# Patient Record
Sex: Female | Born: 1950 | Race: White | Hispanic: No | Marital: Married | Smoking: Former smoker
Health system: Southern US, Community
[De-identification: ages and names within clinical notes are randomized; demographics above are authoritative.]

## PROBLEM LIST (undated history)

## (undated) DIAGNOSIS — I10 Essential (primary) hypertension: Secondary | ICD-10-CM

## (undated) DIAGNOSIS — E119 Type 2 diabetes mellitus without complications: Secondary | ICD-10-CM

## (undated) DIAGNOSIS — I2510 Atherosclerotic heart disease of native coronary artery without angina pectoris: Secondary | ICD-10-CM

## (undated) DIAGNOSIS — D649 Anemia, unspecified: Secondary | ICD-10-CM

## (undated) DIAGNOSIS — J449 Chronic obstructive pulmonary disease, unspecified: Secondary | ICD-10-CM

## (undated) DIAGNOSIS — K219 Gastro-esophageal reflux disease without esophagitis: Secondary | ICD-10-CM

## (undated) HISTORY — PX: CARPAL TUNNEL RELEASE: SHX101

## (undated) HISTORY — PX: SPINE SURGERY: SHX786

## (undated) HISTORY — DX: Gastro-esophageal reflux disease without esophagitis: K21.9

## (undated) HISTORY — PX: ABDOMINAL HYSTERECTOMY: SHX81

## (undated) HISTORY — DX: Chronic obstructive pulmonary disease, unspecified: J44.9

---

## 1998-05-14 HISTORY — PX: NECK SURGERY: SHX720

## 2003-04-17 ENCOUNTER — Other Ambulatory Visit: Payer: Self-pay

## 2003-04-27 ENCOUNTER — Other Ambulatory Visit: Payer: Self-pay

## 2004-06-15 ENCOUNTER — Other Ambulatory Visit: Payer: Self-pay

## 2004-06-15 ENCOUNTER — Emergency Department: Payer: Self-pay | Admitting: Emergency Medicine

## 2004-10-21 ENCOUNTER — Emergency Department: Payer: Self-pay | Admitting: Emergency Medicine

## 2005-05-17 ENCOUNTER — Emergency Department: Payer: Self-pay | Admitting: Emergency Medicine

## 2005-05-17 ENCOUNTER — Other Ambulatory Visit: Payer: Self-pay

## 2006-01-31 IMAGING — CT CT CHEST W/ CM
1 of 2 series · 16 of 32 positions shown, 20 images · non-contrast
Comparison: none

REASON FOR EXAM: chest pain
COMMENTS:

PROCEDURE:     CT  - CT CHEST (FOR PE) W  - May 17, 2005 [DATE]
RESULT:     Enhanced emergent Chest CT was obtained for shortness of breath
and elevated D.dimer.

[Series 4: soft tissue · axial · 0.71mm/px · z∈[+234,+486]mm · 16 of 92 slices shown, 20 images]
[im 4/92  mediastinal]
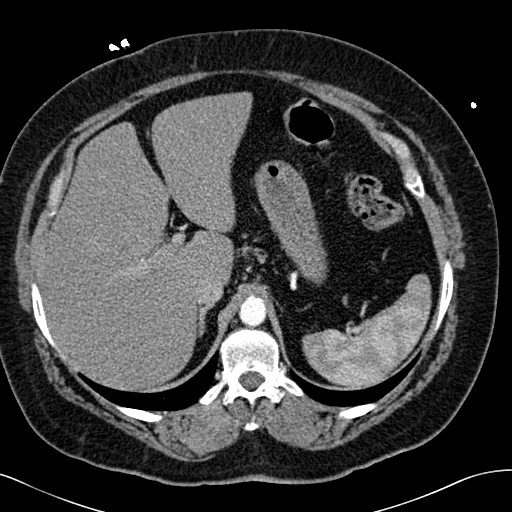
[im 4/92  lung]
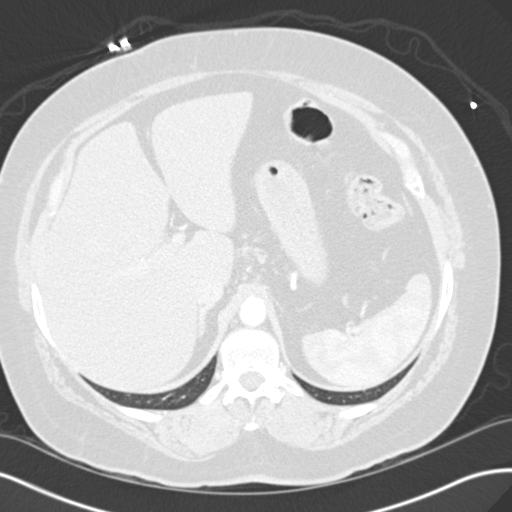
[im 12/92  lung]
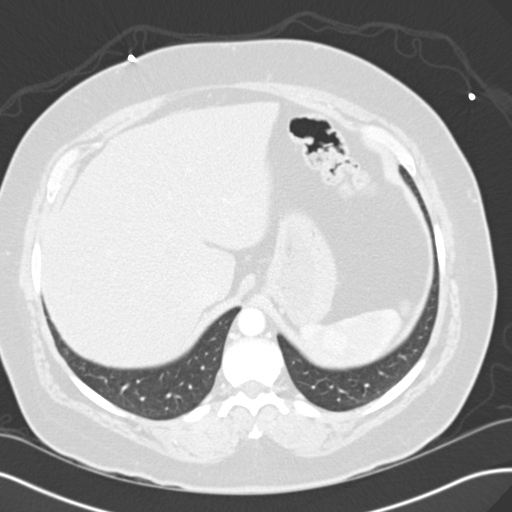
[im 20/92  lung]
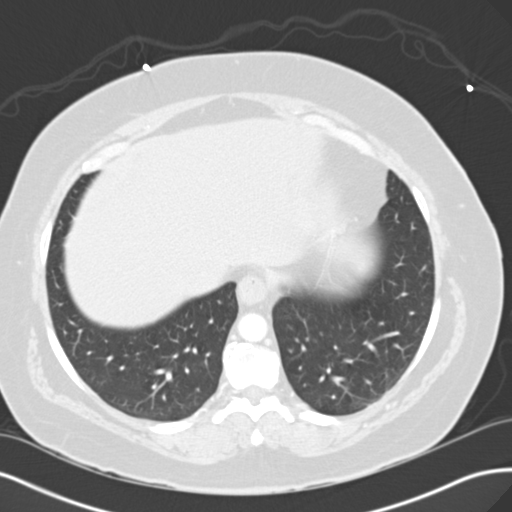
[im 23/92  lung]
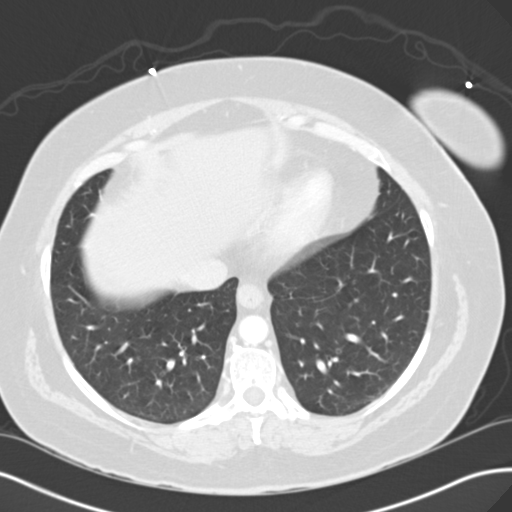
[im 28/92  mediastinal]
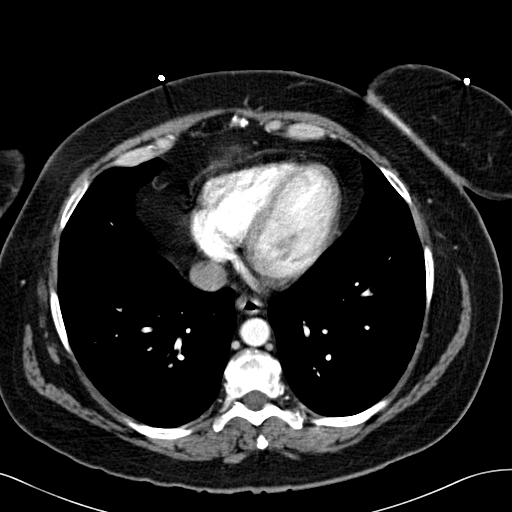
[im 28/92  lung]
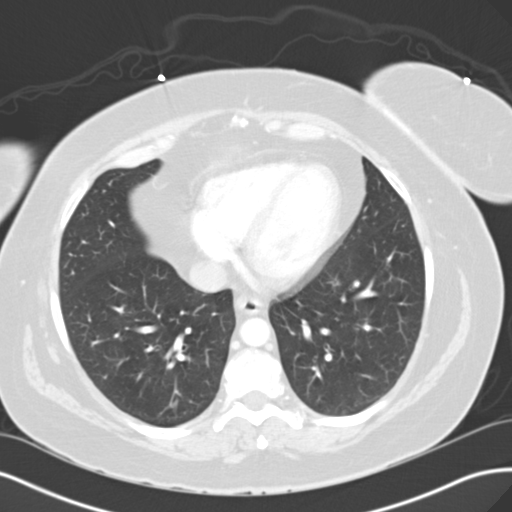
[im 32/92  lung]
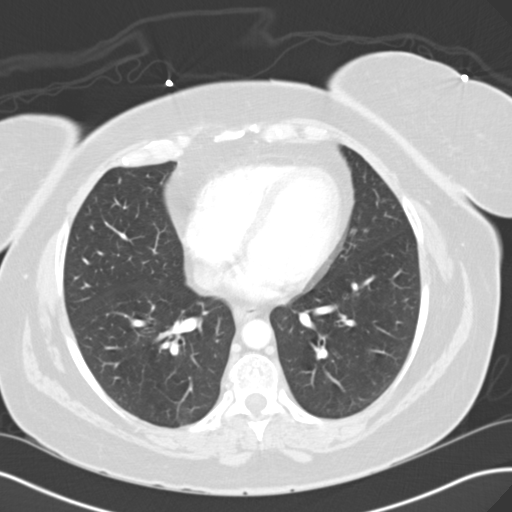
[im 37/92  lung]
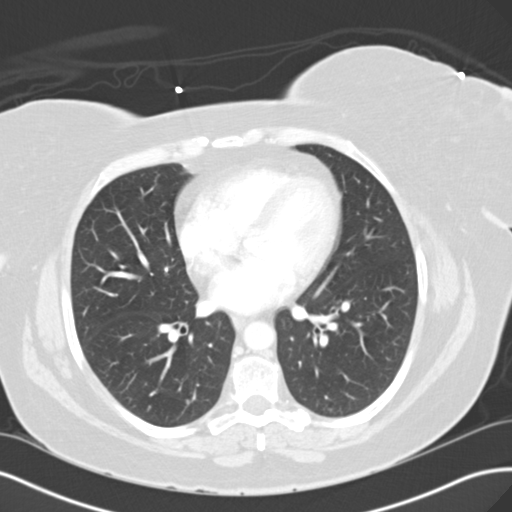
[im 44/92  lung]
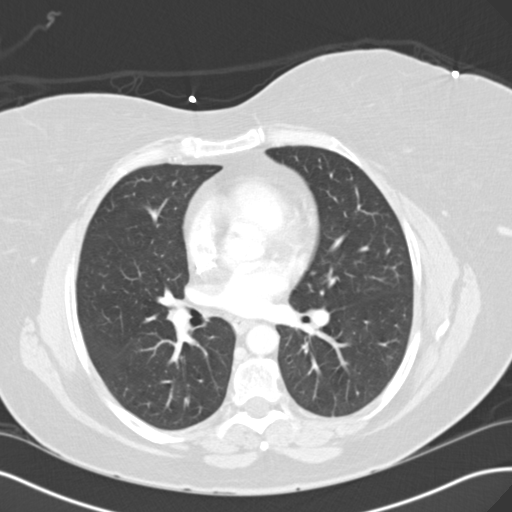
[im 46/92  mediastinal]
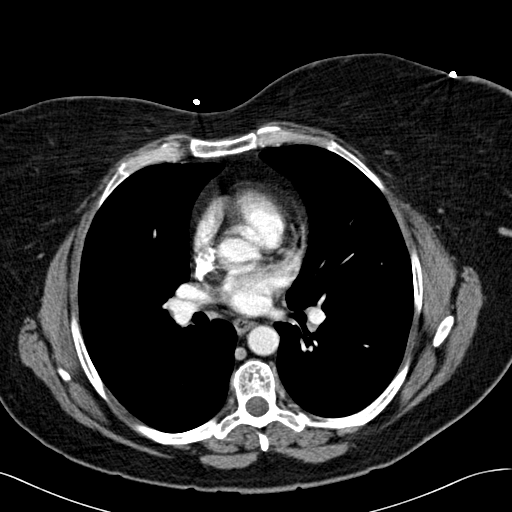
[im 46/92  lung]
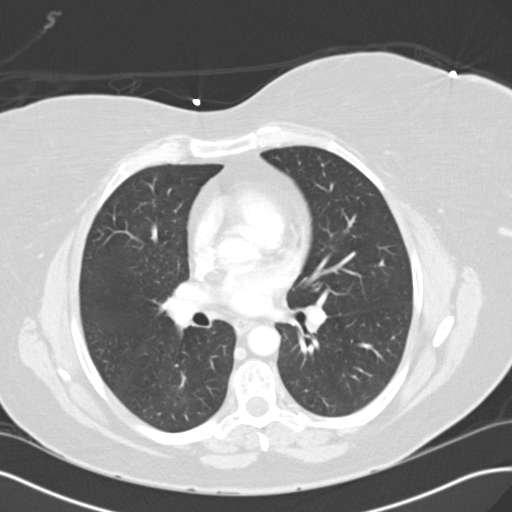
[im 52/92  lung]
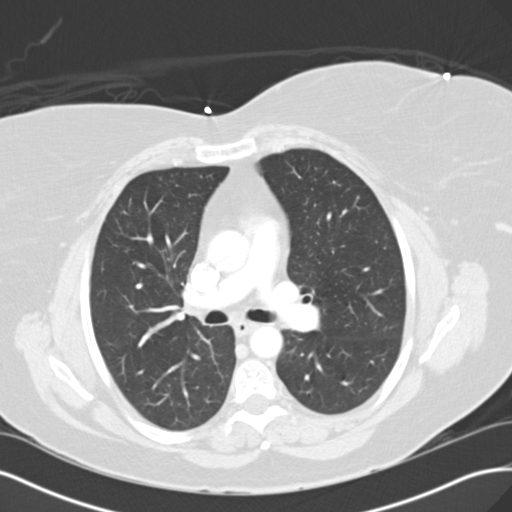
[im 60/92  lung]
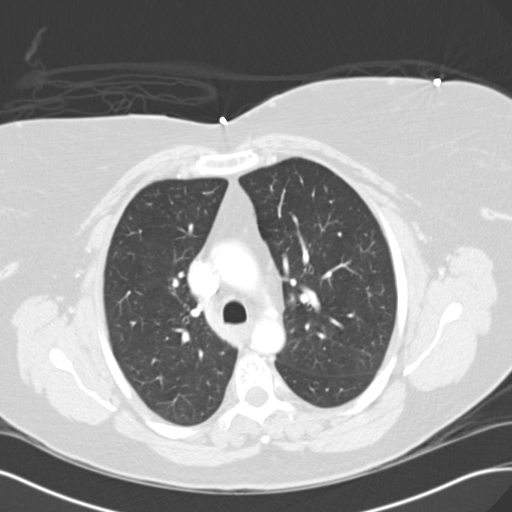
[im 64/92  lung]
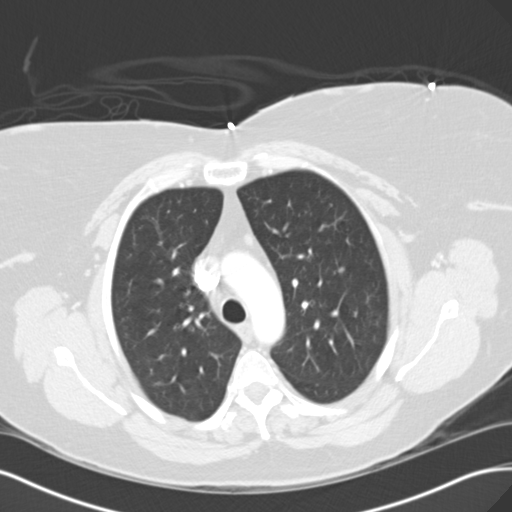
[im 69/92  mediastinal]
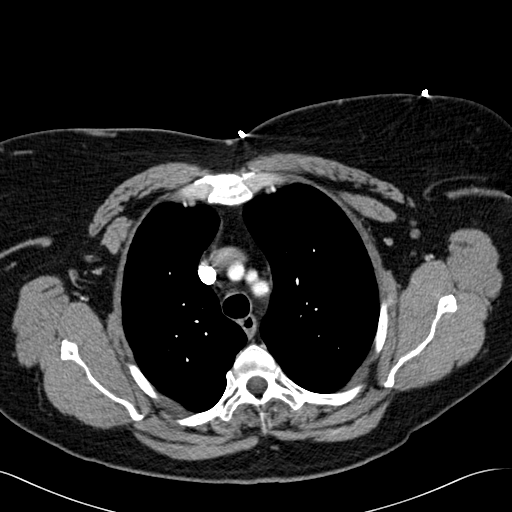
[im 69/92  lung]
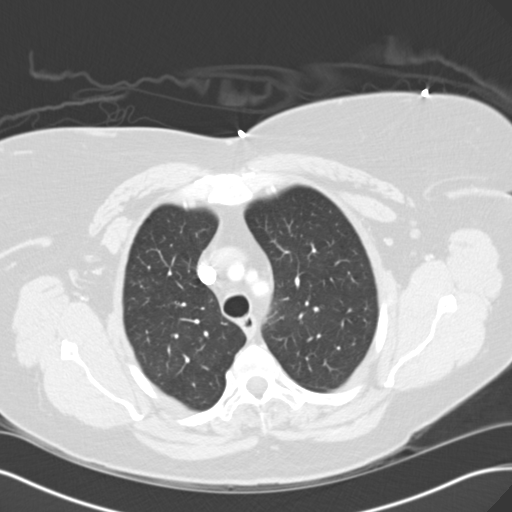
[im 72/92  lung]
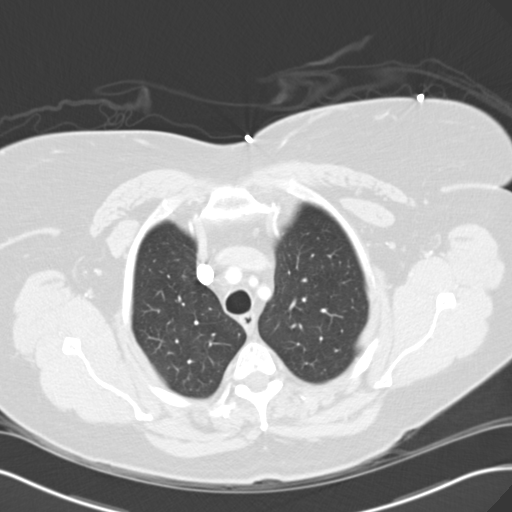
[im 80/92  lung]
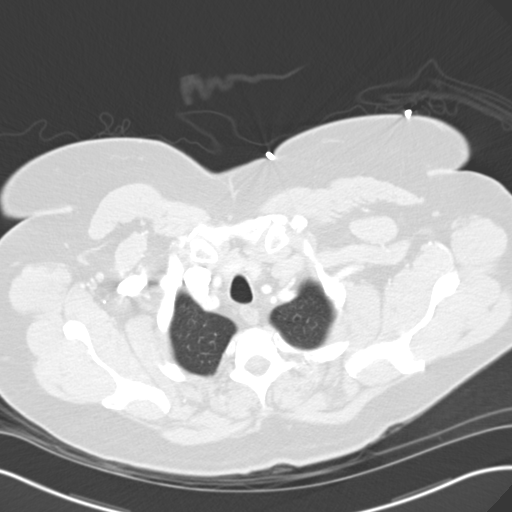
[im 88/92  lung]
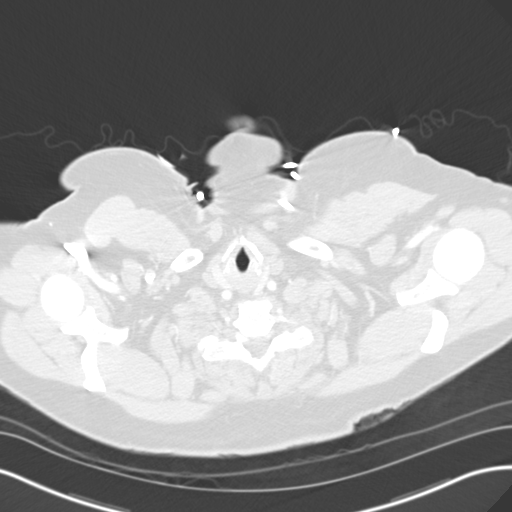

[16 of 32 positions shown; findings below may reference images not displayed]

FINDINGS: There is a good bolus of contrast within the pulmonary arteries.
No filling defects are noted in the visualized portion of the pulmonary
arteries to suggest pulmonary embolus.

No mediastinal masses are seen. No hilar masses are evident. No subcarinal
adenopathy is noted.

On the lung window settings the lung fields appear clear. No effusions are
identified.
IMPRESSION: 1)No definite evidence of pulmonary embolus.

The report was called to the [HOSPITAL] the conclusion of
dictation.

## 2006-03-10 ENCOUNTER — Emergency Department: Payer: Self-pay | Admitting: Emergency Medicine

## 2006-07-08 ENCOUNTER — Ambulatory Visit: Payer: Self-pay

## 2007-07-01 ENCOUNTER — Emergency Department: Payer: Self-pay | Admitting: Emergency Medicine

## 2007-09-25 ENCOUNTER — Emergency Department: Payer: Self-pay | Admitting: Emergency Medicine

## 2007-12-16 ENCOUNTER — Ambulatory Visit: Payer: Self-pay

## 2007-12-30 ENCOUNTER — Ambulatory Visit: Payer: Self-pay | Admitting: Nurse Practitioner

## 2008-03-16 ENCOUNTER — Emergency Department: Payer: Self-pay | Admitting: Emergency Medicine

## 2008-08-23 ENCOUNTER — Emergency Department: Payer: Self-pay | Admitting: Emergency Medicine

## 2009-03-08 ENCOUNTER — Observation Stay: Payer: Self-pay | Admitting: Internal Medicine

## 2009-07-09 ENCOUNTER — Emergency Department: Payer: Self-pay | Admitting: Internal Medicine

## 2009-09-21 ENCOUNTER — Ambulatory Visit: Payer: Self-pay | Admitting: Adult Health

## 2009-10-26 ENCOUNTER — Observation Stay: Payer: Self-pay | Admitting: *Deleted

## 2011-01-24 ENCOUNTER — Emergency Department: Payer: Self-pay | Admitting: Emergency Medicine

## 2011-03-16 ENCOUNTER — Emergency Department: Payer: Self-pay | Admitting: Internal Medicine

## 2012-05-03 ENCOUNTER — Emergency Department: Payer: Self-pay | Admitting: Emergency Medicine

## 2012-05-03 LAB — COMPREHENSIVE METABOLIC PANEL
Albumin: 3.7 g/dL (ref 3.4–5.0)
Anion Gap: 8 (ref 7–16)
Bilirubin,Total: 0.4 mg/dL (ref 0.2–1.0)
Chloride: 101 mmol/L (ref 98–107)
Co2: 26 mmol/L (ref 21–32)
Creatinine: 1.2 mg/dL (ref 0.60–1.30)
EGFR (African American): 56 — ABNORMAL LOW
Glucose: 141 mg/dL — ABNORMAL HIGH (ref 65–99)
Osmolality: 273 (ref 275–301)
Potassium: 3.4 mmol/L — ABNORMAL LOW (ref 3.5–5.1)
SGPT (ALT): 25 U/L (ref 12–78)
Sodium: 135 mmol/L — ABNORMAL LOW (ref 136–145)
Total Protein: 8.1 g/dL (ref 6.4–8.2)

## 2012-05-03 LAB — CBC
HGB: 13.7 g/dL (ref 12.0–16.0)
MCH: 30.4 pg (ref 26.0–34.0)
MCHC: 35.5 g/dL (ref 32.0–36.0)
MCV: 86 fL (ref 80–100)
Platelet: 265 10*3/uL (ref 150–440)
RBC: 4.49 10*6/uL (ref 3.80–5.20)

## 2012-05-03 LAB — URINALYSIS, COMPLETE
Bacteria: NONE SEEN
Bilirubin,UR: NEGATIVE
Blood: NEGATIVE
Ketone: NEGATIVE
Nitrite: NEGATIVE
Ph: 5 (ref 4.5–8.0)
Specific Gravity: 1.006 (ref 1.003–1.030)
Squamous Epithelial: 3
WBC UR: <1 /HPF (ref 0–5)

## 2012-05-03 LAB — RAPID INFLUENZA A&B ANTIGENS

## 2012-09-23 ENCOUNTER — Emergency Department: Payer: Self-pay | Admitting: Emergency Medicine

## 2012-11-23 ENCOUNTER — Emergency Department: Payer: Self-pay | Admitting: Emergency Medicine

## 2013-07-15 ENCOUNTER — Emergency Department: Payer: Self-pay | Admitting: Emergency Medicine

## 2013-07-15 LAB — CBC WITH DIFFERENTIAL/PLATELET
BASOS ABS: 0.1 10*3/uL (ref 0.0–0.1)
Basophil %: 0.7 %
Eosinophil #: 0.3 10*3/uL (ref 0.0–0.7)
Eosinophil %: 3.4 %
HCT: 36.2 % (ref 35.0–47.0)
HGB: 12.2 g/dL (ref 12.0–16.0)
LYMPHS ABS: 2 10*3/uL (ref 1.0–3.6)
LYMPHS PCT: 20 %
MCH: 29.2 pg (ref 26.0–34.0)
MCHC: 33.8 g/dL (ref 32.0–36.0)
MCV: 87 fL (ref 80–100)
MONO ABS: 0.8 x10 3/mm (ref 0.2–0.9)
Monocyte %: 7.8 %
Neutrophil #: 6.8 10*3/uL — ABNORMAL HIGH (ref 1.4–6.5)
Neutrophil %: 68.1 %
PLATELETS: 309 10*3/uL (ref 150–440)
RBC: 4.19 10*6/uL (ref 3.80–5.20)
RDW: 13.7 % (ref 11.5–14.5)
WBC: 10 10*3/uL (ref 3.6–11.0)

## 2013-07-15 LAB — BASIC METABOLIC PANEL
Anion Gap: 4 — ABNORMAL LOW (ref 7–16)
BUN: 14 mg/dL (ref 7–18)
CALCIUM: 8.6 mg/dL (ref 8.5–10.1)
CHLORIDE: 99 mmol/L (ref 98–107)
CREATININE: 1.09 mg/dL (ref 0.60–1.30)
Co2: 31 mmol/L (ref 21–32)
EGFR (African American): >60
EGFR (Non-African Amer.): 54 — ABNORMAL LOW
Glucose: 202 mg/dL — ABNORMAL HIGH (ref 65–99)
Osmolality: 274 (ref 275–301)
POTASSIUM: 4 mmol/L (ref 3.5–5.1)
SODIUM: 134 mmol/L — AB (ref 136–145)

## 2013-07-15 LAB — URIC ACID: URIC ACID: 5.1 mg/dL (ref 2.6–6.0)

## 2014-01-05 ENCOUNTER — Emergency Department: Payer: Self-pay | Admitting: Emergency Medicine

## 2014-03-09 LAB — HEPATIC FUNCTION PANEL
ALT: 12 U/L (ref 7–35)
AST: 12 U/L — AB (ref 13–35)
Alkaline Phosphatase: 71 U/L (ref 25–125)
BILIRUBIN DIRECT: 0.12 mg/dL (ref 0.01–0.4)
Bilirubin, Total: 0.4 mg/dL

## 2014-03-09 LAB — TSH: TSH: 2.01 u[IU]/mL (ref 0.41–5.90)

## 2014-04-13 ENCOUNTER — Emergency Department: Payer: Self-pay | Admitting: Emergency Medicine

## 2014-04-25 ENCOUNTER — Emergency Department: Payer: Self-pay | Admitting: Emergency Medicine

## 2014-05-25 ENCOUNTER — Emergency Department: Payer: Self-pay | Admitting: Emergency Medicine

## 2014-07-06 LAB — BASIC METABOLIC PANEL
BUN: 10 mg/dL (ref 4–21)
Creatinine: 0.9 mg/dL (ref 0.5–1.1)
GLUCOSE: 193 mg/dL
Potassium: 4.3 mmol/L (ref 3.4–5.3)
SODIUM: 138 mmol/L (ref 137–147)

## 2014-07-06 LAB — LIPID PANEL
Cholesterol: 133 mg/dL (ref 0–200)
HDL: 43 mg/dL (ref 35–70)
LDL CALC: 50 mg/dL
Triglycerides: 199 mg/dL — AB (ref 40–160)

## 2014-07-06 LAB — CBC AND DIFFERENTIAL
HCT: 37 % (ref 36–46)
Hemoglobin: 12.1 g/dL (ref 12.0–16.0)
NEUTROS ABS: 7 /uL
Platelets: 368 10*3/uL (ref 150–399)
WBC: 10.8 10^3/mL

## 2014-07-06 LAB — HEMOGLOBIN A1C: Hemoglobin A1C: 7.2

## 2014-08-24 ENCOUNTER — Ambulatory Visit
Admit: 2014-08-24 | Disposition: A | Discharge status: Home / Self Care | Payer: Self-pay | Attending: Internal Medicine | Admitting: Internal Medicine

## 2014-09-27 ENCOUNTER — Encounter (INDEPENDENT_AMBULATORY_CARE_PROVIDER_SITE_OTHER): Payer: Self-pay

## 2014-09-27 ENCOUNTER — Encounter: Payer: Self-pay | Admitting: Pharmacist

## 2014-11-09 DIAGNOSIS — C801 Malignant (primary) neoplasm, unspecified: Secondary | ICD-10-CM | POA: Insufficient documentation

## 2014-11-09 DIAGNOSIS — I10 Essential (primary) hypertension: Secondary | ICD-10-CM | POA: Insufficient documentation

## 2014-11-09 DIAGNOSIS — E785 Hyperlipidemia, unspecified: Secondary | ICD-10-CM | POA: Insufficient documentation

## 2014-11-09 DIAGNOSIS — E119 Type 2 diabetes mellitus without complications: Secondary | ICD-10-CM | POA: Insufficient documentation

## 2015-01-09 IMAGING — CR DG FOOT COMPLETE 3+V*L*
1 series · 3 of 3 positions shown · non-contrast
Comparison: None.

CLINICAL DATA: Pain across the dorsal aspect of the foot. Dropped
can on foot last night. No swelling. Redness across the mid
tarsal/proximal metatarsal area. Unable to bear weight.

EXAM:
LEFT FOOT - COMPLETE 3+ VIEW

[Series 1: dxr foot lt comp w/obliques · 0.14mm/px · 3 of 3 slices shown]
[im 1/3]
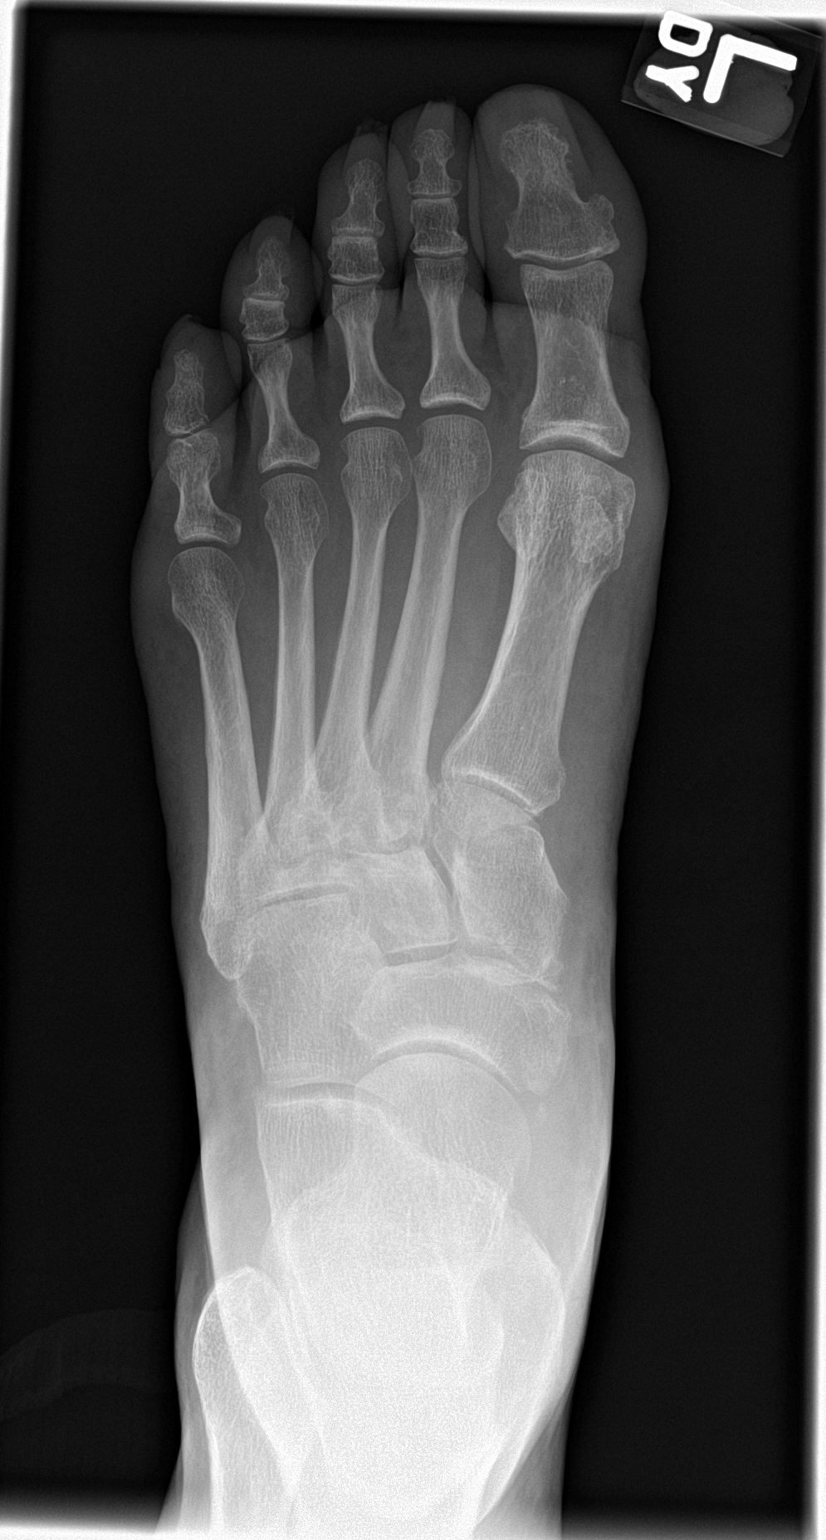
[im 2/3]
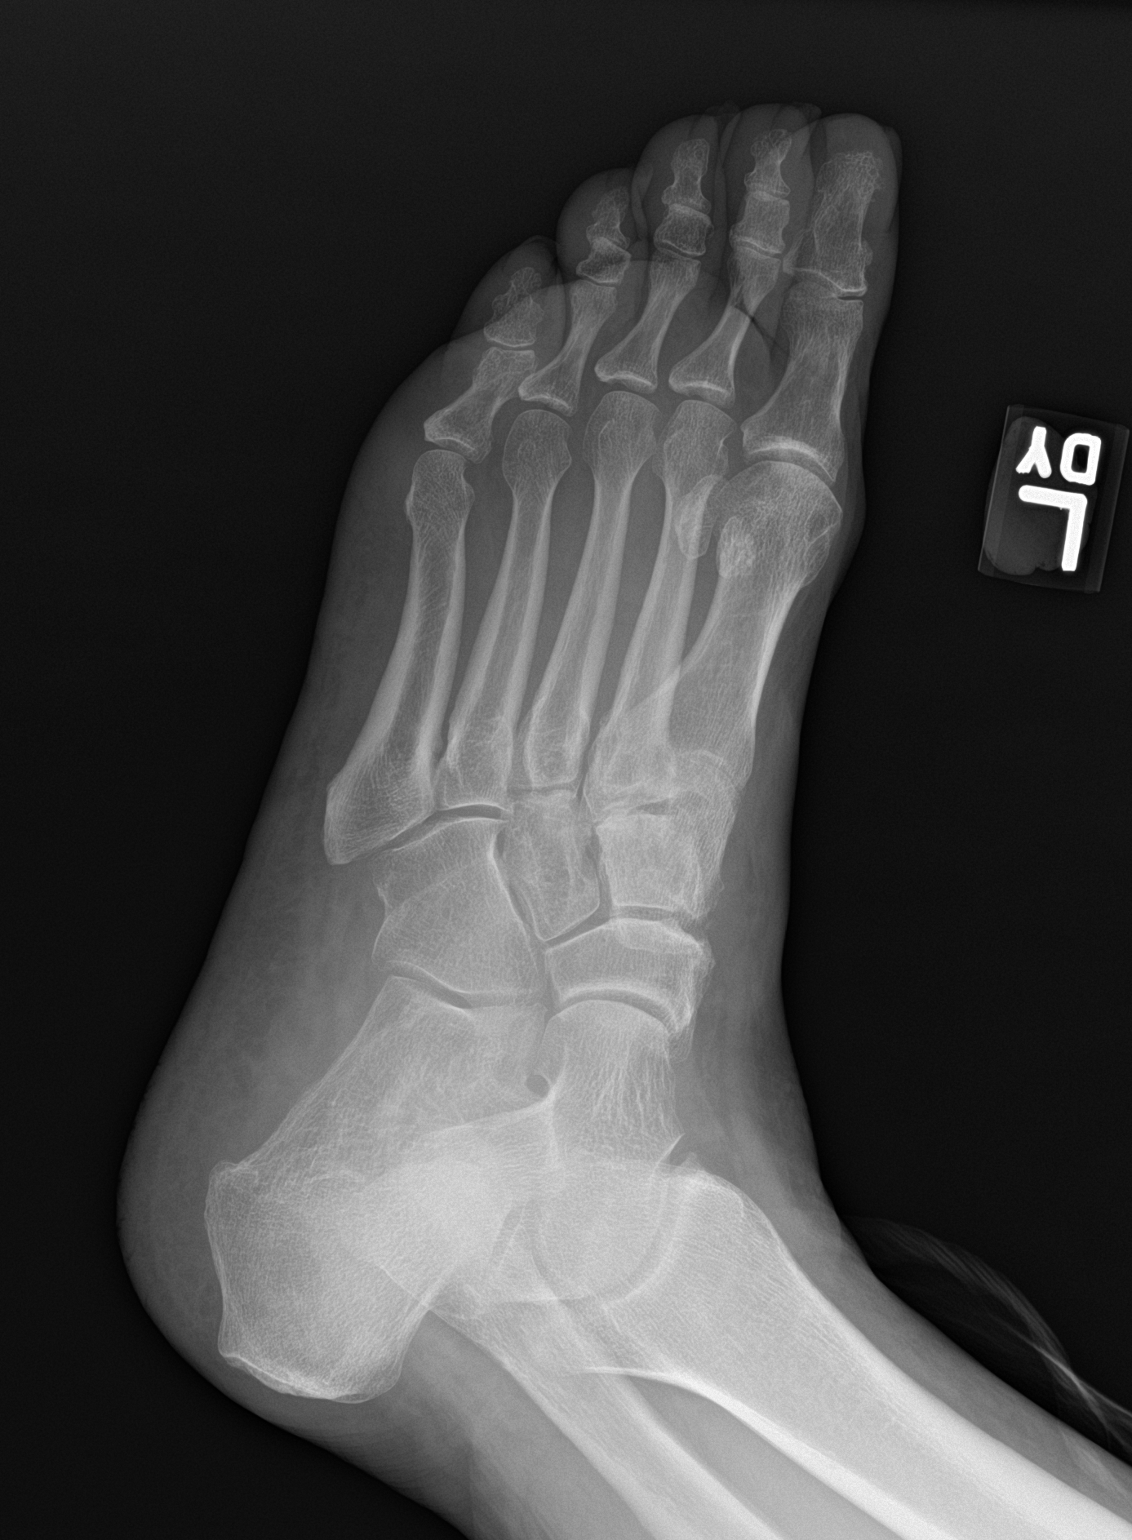
[im 3/3]
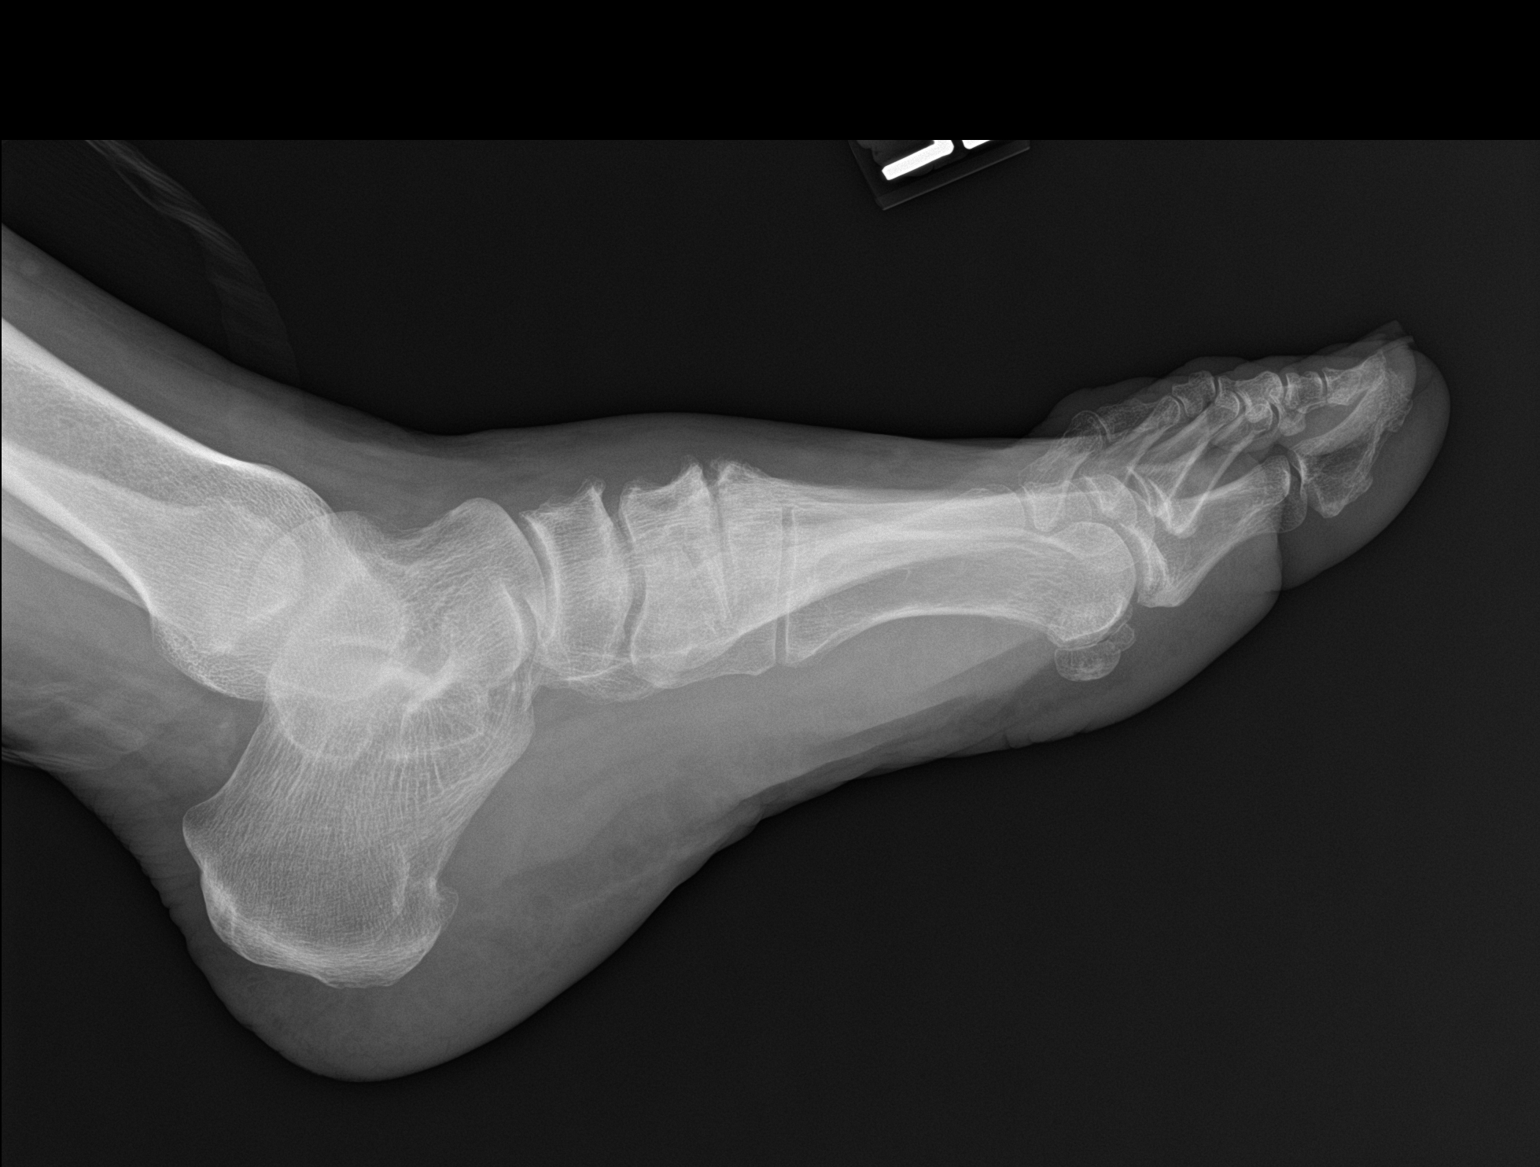

[3 of 3 positions shown; findings below may reference images not displayed]

FINDINGS: There is soft tissue swelling along the dorsum of the foot. No acute
fracture or subluxation. No radiopaque foreign body or soft tissue
gas.
IMPRESSION: Soft tissue swelling.

## 2015-01-21 ENCOUNTER — Other Ambulatory Visit (HOSPITAL_COMMUNITY): Payer: Self-pay | Admitting: *Deleted

## 2015-01-24 ENCOUNTER — Other Ambulatory Visit: Payer: Self-pay | Admitting: *Deleted

## 2015-01-24 DIAGNOSIS — M5442 Lumbago with sciatica, left side: Secondary | ICD-10-CM

## 2015-02-01 ENCOUNTER — Ambulatory Visit: Admission: RE | Admit: 2015-02-01 | Payer: Medicare HMO | Source: Ambulatory Visit

## 2015-02-10 ENCOUNTER — Ambulatory Visit: Admission: RE | Admit: 2015-02-10 | Payer: Commercial Managed Care - HMO | Source: Ambulatory Visit

## 2015-02-22 ENCOUNTER — Ambulatory Visit
Admission: RE | Admit: 2015-02-22 | Discharge: 2015-02-22 | Disposition: A | Discharge status: Home / Self Care | Payer: Medicare HMO | Source: Ambulatory Visit | Attending: *Deleted | Admitting: *Deleted

## 2015-03-03 ENCOUNTER — Ambulatory Visit
Admission: RE | Admit: 2015-03-03 | Discharge: 2015-03-03 | Disposition: A | Discharge status: Home / Self Care | Payer: Medicare HMO | Source: Ambulatory Visit | Attending: *Deleted | Admitting: *Deleted

## 2015-03-03 DIAGNOSIS — M5442 Lumbago with sciatica, left side: Secondary | ICD-10-CM | POA: Diagnosis present

## 2015-03-03 DIAGNOSIS — M4806 Spinal stenosis, lumbar region: Secondary | ICD-10-CM | POA: Insufficient documentation

## 2015-03-03 DIAGNOSIS — Q7649 Other congenital malformations of spine, not associated with scoliosis: Secondary | ICD-10-CM | POA: Diagnosis not present

## 2015-03-22 ENCOUNTER — Encounter: Payer: Self-pay | Admitting: Pharmacist

## 2015-06-20 DIAGNOSIS — E119 Type 2 diabetes mellitus without complications: Secondary | ICD-10-CM | POA: Insufficient documentation

## 2015-09-10 ENCOUNTER — Encounter: Payer: Self-pay | Admitting: Emergency Medicine

## 2015-09-10 ENCOUNTER — Emergency Department
Admission: EM | Admit: 2015-09-10 | Discharge: 2015-09-10 | Disposition: A | Discharge status: Home / Self Care | Payer: Medicare Other | Attending: Emergency Medicine | Admitting: Emergency Medicine

## 2015-09-10 DIAGNOSIS — Z87891 Personal history of nicotine dependence: Secondary | ICD-10-CM | POA: Diagnosis not present

## 2015-09-10 DIAGNOSIS — E119 Type 2 diabetes mellitus without complications: Secondary | ICD-10-CM | POA: Diagnosis not present

## 2015-09-10 DIAGNOSIS — W57XXXA Bitten or stung by nonvenomous insect and other nonvenomous arthropods, initial encounter: Secondary | ICD-10-CM | POA: Insufficient documentation

## 2015-09-10 DIAGNOSIS — S60561A Insect bite (nonvenomous) of right hand, initial encounter: Secondary | ICD-10-CM | POA: Insufficient documentation

## 2015-09-10 DIAGNOSIS — Y929 Unspecified place or not applicable: Secondary | ICD-10-CM | POA: Insufficient documentation

## 2015-09-10 DIAGNOSIS — I10 Essential (primary) hypertension: Secondary | ICD-10-CM | POA: Diagnosis not present

## 2015-09-10 DIAGNOSIS — Y999 Unspecified external cause status: Secondary | ICD-10-CM | POA: Insufficient documentation

## 2015-09-10 DIAGNOSIS — Y939 Activity, unspecified: Secondary | ICD-10-CM | POA: Insufficient documentation

## 2015-09-10 HISTORY — DX: Type 2 diabetes mellitus without complications: E11.9

## 2015-09-10 HISTORY — DX: Essential (primary) hypertension: I10

## 2015-09-10 MED ORDER — NAPROXEN 500 MG PO TABS
500.0000 mg | ORAL_TABLET | Freq: Once | ORAL | Status: AC
  Administered 2015-09-10: 500 mg via ORAL
  Filled 2015-09-10: qty 1

## 2015-09-10 MED ORDER — DIPHENHYDRAMINE HCL 25 MG PO CAPS
50.0000 mg | ORAL_CAPSULE | Freq: Once | ORAL | Status: AC
  Administered 2015-09-10: 50 mg via ORAL
  Filled 2015-09-10: qty 2

## 2015-09-10 MED ORDER — NAPROXEN 500 MG PO TBEC: 500.0000 mg | DELAYED_RELEASE_TABLET | Freq: Two times a day (BID) | ORAL | Status: DC

## 2015-09-10 NOTE — ED Notes (Signed)
Discussed discharge instructions, prescriptions, and follow-up care with patient. No questions or concerns at this time. Pt stable at discharge.  

## 2015-09-10 NOTE — Discharge Instructions (Signed)
Insect Bite Mosquitoes, flies, fleas, bedbugs, and many other insects can bite. Insect bites are different from insect stings. A sting is when poison (venom) is injected into the skin. Insect bites can cause pain or itching for a few days, but they are usually not serious. Some insects can spread diseases to people through a bite. SYMPTOMS  Symptoms of an insect bite include:  Itching or pain in the bite area.  Redness and swelling in the bite area.  An open wound (skin ulcer). In many cases, symptoms last for 2-4 days.  DIAGNOSIS  This condition is usually diagnosed based on symptoms and a physical exam. TREATMENT  Treatment is usually not needed for an insect bite. Symptoms often go away on their own. Your health care provider may recommend creams or lotions to help reduce itching. Antibiotic medicines may be prescribed if the bite becomes infected. A tetanus shot may be given in some cases. If you develop an allergic reaction to an insect bite, your health care provider will prescribe medicines to treat the reaction (antihistamines). This is rare. HOME CARE INSTRUCTIONS  Do not scratch the bite area.  Keep the bite area clean and dry. Wash the bite area daily with soap and water as told by your health care provider.  If directed, applyice to the bite area.  Put ice in a plastic bag.  Place a towel between your skin and the bag.  Leave the ice on for 20 minutes, 2-3 times per day.  To help reduce itching and swelling, try applying a baking soda paste, cortisone cream, or calamine lotion to the bite area as told by your health care provider.  Apply or take over-the-counter and prescription medicines only as told by your health care provider.  If you were prescribed an antibiotic medicine, use it as told by your health care provider. Do not stop using the antibiotic even if your condition improves.  Keep all follow-up visits as told by your health care provider. This is  important. PREVENTION   Use insect repellent. The best insect repellents contain:  DEET, picaridin, oil of lemon eucalyptus (OLE), or IR3535.  Higher amounts of an active ingredient.  When you are outdoors, wear clothing that covers your arms and legs.  Avoid opening windows that do not have window screens. SEEK MEDICAL CARE IF:  You have increased redness, swelling, or pain in the bite area.  You have a fever. SEEK IMMEDIATE MEDICAL CARE IF:   You have joint pain.   You have fluid, blood, or pus coming from the bite area.  You have a headache or neck pain.  You have unusual weakness.  You have a rash.  You have chest pain or shortness of breath.  You have abdominal pain, nausea, or vomiting.  You feel unusually tired or sleepy.   This information is not intended to replace advice given to you by your health care provider. Make sure you discuss any questions you have with your health care provider.   Document Released: 06/07/2004 Document Revised: 01/19/2015 Document Reviewed: 09/15/2014 Elsevier Interactive Patient Education 2016 [place] appear to be having an allergic reaction to an insect sting to your hand. Take Benadryl every 4-6 hours as needed. Apply ice to reduce swelling.

## 2015-09-10 NOTE — ED Notes (Signed)
Reports "something bit me".  Swelling and itching to right hand.

## 2015-09-10 NOTE — ED Provider Notes (Signed)
[facility] Emergency Department Provider Note ____________________________________________  Time seen: 1056  I have reviewed the triage vital signs and the nursing notes.  HISTORY  Chief Complaint  Hand Pain  HPI Heidi Edwards is a 65 y.o. female presents to the ED for the evaluation of an insect bite or sting to her right hand. The patient's [place] Yesterday Evening, When She Felt an Immediate Bite or Sting to Her Dorsal Right Hand. She immediately Slapped the Hand, but Did Not See the Insect.She noted itching and minimal swelling locally. She denies any first aid measures in the interim. She denies any history of anaphylaxis, hypersensitivity, or atopy. She is without complaints of respiratory distress or mucous membrane swelling.   Past Medical History  Diagnosis Date  . Hypertension   . Diabetes mellitus without complication ([place])     There are no active problems to display for this patient.   Past Surgical History  Procedure Laterality Date  . Abdominal hysterectomy      Current Outpatient Rx  Name  Route  Sig  Dispense  Refill  . naproxen (EC NAPROSYN) 500 MG EC tablet   Oral   Take 1 tablet (500 mg total) by mouth 2 (two) times daily with a meal.   30 tablet   0    Allergies Review of patient's allergies indicates no known allergies.  No family history on file.  Social History Social History  Substance Use Topics  . Smoking status: Former Research scientist (life sciences)  . Smokeless tobacco: None  . Alcohol Use: None   Review of Systems  Constitutional: Negative for fever. Eyes: Negative for visual changes. ENT: Negative for sore throat. Cardiovascular: Negative for chest pain. Respiratory: Negative for shortness of breath. Skin: Negative for rash. Right hand swelling and itching as above.  Neurological: Negative for headaches, focal weakness or numbness. ____________________________________________  PHYSICAL EXAM:  VITAL SIGNS: ED Triage  Vitals  Enc Vitals Group     BP 09/10/15 0917 153/61 mmHg     Pulse Rate 09/10/15 0917 98     Resp 09/10/15 0917 18     Temp 09/10/15 0917 98.1 F (36.7 C)     Temp Source 09/10/15 0917 Oral     SpO2 09/10/15 0917 98 %     Weight 09/10/15 0917 195 lb (88.451 kg)     Height 09/10/15 0917 5\' 5"  (1.651 m)     Head Cir --      Peak Flow --      Pain Score --      Pain Loc --      Pain Edu? --      Excl. in [place]? --    Constitutional: Alert and oriented. Well appearing and in no distress. Head: Normocephalic and atraumatic. Hematological/Lymphatic/Immunological: No epitrochlear lymphadenopathy. Cardiovascular: Normal distal pulses and capillary refill. Respiratory: Normal respiratory effort.  Musculoskeletal: Nontender with normal range of motion in all extremities.  Neurologic:  Normal gait without ataxia. Normal speech and language. No gross focal neurologic deficits are appreciated. Skin:  Skin is warm, dry and intact. No rash noted. Right hand with moderate dorsal swelling and some moderate faint, erythema from the knuckles to the wrist. No induration, pointing fluctuance, or drainage.  ____________________________________________  PROCEDURES  Benadryl 50 mg PO Naprosyn 500 mg PO ____________________________________________  INITIAL IMPRESSION / ASSESSMENT AND PLAN / ED COURSE  Patient with a local reaction to an insect bite or sting to the dorsal right hand. There is no sign of  general anaphylaxis or infection or cellulitis. Patient is provided with antihistamine and anti-inflammatory medications in the ED. She is advised to continue to apply ice to the hand for comfort. She will dose over-the-counter Benadryl every 4-6 hours as needed. She is also provided with a prescription for Naprosyn to dose as needed for pain and inflammation. She should follow with her primary care provider for ongoing symptom management. ____________________________________________  FINAL CLINICAL  IMPRESSION(S) / ED DIAGNOSES  Final diagnoses:  Insect bite of right hand with local reaction, initial encounter      Melvenia Needles, PA-C 09/10/15 [place], MD 09/10/15 1337

## 2015-09-26 ENCOUNTER — Encounter: Payer: Self-pay | Admitting: Pharmacist

## 2015-11-22 ENCOUNTER — Ambulatory Visit: Payer: Commercial Managed Care - HMO | Admitting: Anesthesiology

## 2015-12-13 ENCOUNTER — Encounter: Payer: Self-pay | Admitting: Pain Medicine

## 2015-12-13 ENCOUNTER — Ambulatory Visit: Payer: Medicare Other | Attending: Pain Medicine | Admitting: Pain Medicine

## 2015-12-13 ENCOUNTER — Encounter (INDEPENDENT_AMBULATORY_CARE_PROVIDER_SITE_OTHER): Payer: Self-pay

## 2015-12-13 VITALS — BP 160/65 | HR 84 | Temp 98.0°F | Resp 16 | Ht 65.0 in | Wt 200.0 lb

## 2015-12-13 DIAGNOSIS — E0841 Diabetes mellitus due to underlying condition with diabetic mononeuropathy: Secondary | ICD-10-CM

## 2015-12-13 DIAGNOSIS — M533 Sacrococcygeal disorders, not elsewhere classified: Secondary | ICD-10-CM | POA: Diagnosis not present

## 2015-12-13 DIAGNOSIS — I10 Essential (primary) hypertension: Secondary | ICD-10-CM | POA: Insufficient documentation

## 2015-12-13 DIAGNOSIS — M47816 Spondylosis without myelopathy or radiculopathy, lumbar region: Secondary | ICD-10-CM

## 2015-12-13 DIAGNOSIS — M5126 Other intervertebral disc displacement, lumbar region: Secondary | ICD-10-CM | POA: Insufficient documentation

## 2015-12-13 DIAGNOSIS — M5136 Other intervertebral disc degeneration, lumbar region: Secondary | ICD-10-CM | POA: Diagnosis not present

## 2015-12-13 DIAGNOSIS — M1990 Unspecified osteoarthritis, unspecified site: Secondary | ICD-10-CM | POA: Diagnosis not present

## 2015-12-13 DIAGNOSIS — M1288 Other specific arthropathies, not elsewhere classified, other specified site: Secondary | ICD-10-CM | POA: Insufficient documentation

## 2015-12-13 DIAGNOSIS — K219 Gastro-esophageal reflux disease without esophagitis: Secondary | ICD-10-CM | POA: Insufficient documentation

## 2015-12-13 DIAGNOSIS — M4806 Spinal stenosis, lumbar region: Secondary | ICD-10-CM | POA: Insufficient documentation

## 2015-12-13 DIAGNOSIS — J40 Bronchitis, not specified as acute or chronic: Secondary | ICD-10-CM | POA: Insufficient documentation

## 2015-12-13 DIAGNOSIS — E1140 Type 2 diabetes mellitus with diabetic neuropathy, unspecified: Secondary | ICD-10-CM | POA: Insufficient documentation

## 2015-12-13 DIAGNOSIS — Z9889 Other specified postprocedural states: Secondary | ICD-10-CM | POA: Diagnosis not present

## 2015-12-13 DIAGNOSIS — M5030 Other cervical disc degeneration, unspecified cervical region: Secondary | ICD-10-CM | POA: Diagnosis not present

## 2015-12-13 DIAGNOSIS — M545 Low back pain: Secondary | ICD-10-CM | POA: Diagnosis present

## 2015-12-13 NOTE — Progress Notes (Signed)
Safety precautions to be maintained throughout the outpatient stay will include: orient to surroundings, keep bed in low position, maintain call bell within reach at all times, provide assistance with transfer out of bed and ambulation . New Patient

## 2015-12-13 NOTE — Patient Instructions (Addendum)
PLAN  Continue present medications  Recommend patient consider lumbar epidural steroid injection or lumbar facet, medial branch nerve block  F/U PCP at [facility] for evaluation of  BP and general medical  condition  F/U surgical evaluation. Plan to proceed with neurosurgical evaluation pending response to treatment  F/U neurological evaluation. May consider pending follow-up evaluations  May consider radiofrequency rhizolysis or intraspinal procedures pending response to present treatment and F/U evaluation   Patient to call Pain Management Center should patient have concerns prior to scheduled return appointment. Pain Management Discharge Instructions  General Discharge Instructions :  If you need to reach your doctor call: Monday-Friday 8:00 am - 4:00 pm at [phone] or toll free [phone].  After clinic hours [phone] to have operator reach doctor.  Bring all of your medication bottles to all your appointments in the pain clinic.  To cancel or reschedule your appointment with Pain Management please remember to call 24 hours in advance to avoid a fee.  Refer to the educational materials which you have been given on: General Risks, I had my Procedure. Discharge Instructions, Post Sedation.  Post Procedure Instructions:  The drugs you were given will stay in your system until tomorrow, so for the next 24 hours you should not drive, make any legal decisions or drink any alcoholic beverages.  You may eat anything you prefer, but it is better to start with liquids then soups and crackers, and gradually work up to solid foods.  Please notify your doctor immediately if you have any unusual bleeding, trouble breathing or pain that is not related to your normal pain.  Depending on the type of procedure that was done, some parts of your body may feel week and/or numb.  This usually clears up by tonight or the next day.  Walk with the use of an assistive device or  accompanied by an adult for the 24 hours.  You may use ice on the affected area for the first 24 hours.  Put ice in a Ziploc bag and cover with a towel and place against area 15 minutes on 15 minutes off.  You may switch to heat after 24 hours.

## 2015-12-13 NOTE — Progress Notes (Signed)
The patient is a 65 year old female who comes to pain management at the request of primary care physician Danae Orleans for further evaluation and treatment of pain involving the lumbar and lower extremity region. The patient is with prior history of surgical intervention of the cervical region more than 20 years ago. The patient states that the present time she has significant lower back lower extremity pain with increased pain with standing walking. The patient states that the pain also is associated with weakness of the lower extremities. The patient has a diagnosis of diabetes mellitus in addition to degenerative disc disease of the lumbar spine. We discussed patient's condition on today's visit and patient described her pain as constant dull sharp stabbing 10 tenderness throbbing uncomfortable sensation associated with spasms and weakness of the lower extremities. The patient stated the pain increased with bending and lifting sitting standing walking. The patient stated the pain decreases with warm showers and baths and medications. We informed patient that patient may benefit from lumbar epidural steroid injection or lumbar facet, medial branch nerve blocks. At the present time the patient will consider interventional treatment. We will also consider treatment of patient's pending follow-up evaluation of patient and reassessment of patient's condition. We have recommended patient consider lumbar epidural steroid injection and/or lumbar facet, medial branch nerve, blocks and we will also consider prescribing medications for treatment of patient's pain pending follow-up evaluations and response to recommended treatment. All agreed to suggested treatment plan     Cardiovascular: High blood pressure  Pulmonary: Bronchitis  Neurological: Unremarkable  Psychological: Unremarkable  Gastrointestinal: Gastroesophageal reflux  disease  Genitourinary: Unremarkable  Hematologic: Unremarkable  Endocrine: Diabetes mellitus  Rheumatological: Osteoarthritis  Musculoskeletal: Unremarkable  Other significant: Unremarkable      Physical examination  There was tenderness of the splenius capitis and occipitalis region a moderate degree with well-healed surgical scar of the cervical region without increased warmth and erythema in the region of the scar. The patient was with unremarkable Spurling's maneuver and had mild difficulty performing drop test. There was slightly decreased grip strength. Tinel and Phalen's maneuver were without increased pain of significant degree. Palpation over the cervical facet cervical paraspinal musculature regions reproduced mild discomfort. Palpation. Palpation over the lumbar region was of increased pain of moderate degree with lateral bending rotation extension and palpation over the lumbar facets reproducing moderate discomfort. There was moderate tenderness of the PSIS and PII S region a mild tenderness to moderate tenderness of the greater trochanteric region iliotibial band region. The knees were with crepitus of the knees with negative anterior and posterior drawer signs without ballottement of the patella. Straight leg raise was tolerates approximately 30 without a definite increase of pain with dorsiflexion noted. EHL strength appeared to be decreased. There appeared to be decreased sensation of the lower extremities in a stocking-type distribution with negative clonus negative Homans. Abdomen nontender with no costovertebral tenderness noted over the thoracic region was with tenderness to palpation of the lower thoracic region with no crepitus      Assessment   Degenerative disc disease lumbar spine  L1-L2: No significant disc bulge. No evidence of neural foraminal stenosis. No central canal stenosis.  L2-L3: Mild broad-based disc bulge. Moderate right and mild  left facet arthropathy. Mild right foraminal narrowing. No left foraminal narrowing. No central canal stenosis.  L3-L4: Mild broad-based disc bulge. Moderate bilateral facet arthropathy. No evidence of neural foraminal stenosis. No central canal stenosis.  L4-L5: Moderate-sized broad-based disc bulge. Severe bilateral  facet arthropathy with ligamentum flavum infolding resulting in severe bilateral lateral recess stenosis and severe spinal stenosis. Mild left foraminal stenosis. No right foraminal stenosis.  L5-S1: No significant disc bulge. No evidence of neural foraminal stenosis. No central canal stenosis.  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 Please refer to the enumerated sequence (series 3) prior to planning any intervention.  1. At L4-5 there is a moderate-size broad-based disc bulge. Severe bilateral facet arthropathy with ligamentum flavum infolding resulting in severe bilateral lateral recess stenosis and severe spinal stenosis. Mild left foraminal stenosis.  Lumbar stenosis with neurogenic claudication  Lumbar facet syndrome  Sacroiliac joint dysfunction  Degenerative disc disease cervical spine  Post cervical surgery  Cervical facet syndrome  Diabetes mellitus with diabetic neuropathy      PLAN  Continue present medications  Recommend patient consider lumbar epidural steroid injection or lumbar lower extremity pain with what appears to be component of lumbar stenosis with neurogenic claudication Consider lumbar facet, medial branch nerve, blocks as well  F/U PCP at [facility] for evaluation of  BP and general medical  condition  F/U surgical evaluation. Schedule evaluation of lumbar lower extremity pain paresthesias and weakness at time of return appointment as discussed  F/U neurological evaluation. May consider pending follow-up evaluations  May consider radiofrequency rhizolysis or  intraspinal procedures pending response to present treatment and F/U evaluation   Patient to call Pain Management Center should patient have concerns prior to scheduled return appointment.

## 2015-12-20 ENCOUNTER — Other Ambulatory Visit: Payer: Self-pay

## 2015-12-22 LAB — TOXASSURE SELECT 13 (MW), URINE: PDF: 0

## 2015-12-22 NOTE — Progress Notes (Signed)
Reviewed

## 2016-01-10 ENCOUNTER — Encounter: Payer: Self-pay | Admitting: Pain Medicine

## 2016-01-10 ENCOUNTER — Ambulatory Visit: Payer: Medicare Other | Attending: Pain Medicine | Admitting: Pain Medicine

## 2016-01-10 VITALS — BP 162/67 | HR 92 | Temp 98.3°F | Resp 16 | Ht 65.0 in | Wt 196.0 lb

## 2016-01-10 DIAGNOSIS — M4806 Spinal stenosis, lumbar region: Secondary | ICD-10-CM | POA: Insufficient documentation

## 2016-01-10 DIAGNOSIS — E785 Hyperlipidemia, unspecified: Secondary | ICD-10-CM

## 2016-01-10 DIAGNOSIS — M5030 Other cervical disc degeneration, unspecified cervical region: Secondary | ICD-10-CM | POA: Insufficient documentation

## 2016-01-10 DIAGNOSIS — Z9889 Other specified postprocedural states: Secondary | ICD-10-CM | POA: Insufficient documentation

## 2016-01-10 DIAGNOSIS — M47816 Spondylosis without myelopathy or radiculopathy, lumbar region: Secondary | ICD-10-CM

## 2016-01-10 DIAGNOSIS — M79605 Pain in left leg: Secondary | ICD-10-CM | POA: Diagnosis present

## 2016-01-10 DIAGNOSIS — E1140 Type 2 diabetes mellitus with diabetic neuropathy, unspecified: Secondary | ICD-10-CM | POA: Insufficient documentation

## 2016-01-10 DIAGNOSIS — M533 Sacrococcygeal disorders, not elsewhere classified: Secondary | ICD-10-CM | POA: Insufficient documentation

## 2016-01-10 DIAGNOSIS — M1288 Other specific arthropathies, not elsewhere classified, other specified site: Secondary | ICD-10-CM | POA: Diagnosis not present

## 2016-01-10 DIAGNOSIS — M545 Low back pain: Secondary | ICD-10-CM | POA: Diagnosis present

## 2016-01-10 DIAGNOSIS — M5136 Other intervertebral disc degeneration, lumbar region: Secondary | ICD-10-CM | POA: Insufficient documentation

## 2016-01-10 DIAGNOSIS — M79604 Pain in right leg: Secondary | ICD-10-CM | POA: Diagnosis present

## 2016-01-10 MED ORDER — HYDROCODONE-ACETAMINOPHEN 5-325 MG PO TABS: ORAL_TABLET | ORAL | 0 refills | Status: DC

## 2016-01-10 NOTE — Progress Notes (Signed)
The patient is a 65 year old female who returns to pain management for further evaluation and treatment of pain involving the lower back and lower extremity regions predominantly. The patient states that the pain interferes with all activities of daily living. The patient states the pain interferes with ability to perform activities around the house and to engage in other activities. The pain also interferes the patient ability to obtain restful sleep. The patient denies any recent trauma change in events of daily living the call significant change in symptomatology. We discussed patient's condition and informed patient that we would consider patient for interventional treatment consisting of lumbar facet, medial branch nerve, blocks. At the present time we will provide patient with prescription for hydrocodone acetaminophen. Verification of patient's medication was accomplished and patient was provided prescription for hydrocodone acetaminophen ( 5/325 milligrams) limit of 1-3 tablets per day. The patient was given a quantity for 80 pills.. We will consider interventional treatment pending follow-up evaluations as discussed with patient and patient is to call pain management should they be change in condition prior to scheduled return appointment. All agreed to suggested treatment plan.    Physical examination   There was tenderness to palpation of the paraspinal muscular treat the cervical region cervical facet region palpation which reproduces pain of mild degree. There was mild tenderness of the splenius capitis and occipitalis region. Palpation over the region of the acromioclavicular and glenohumeral joint regions reproduced pain of mild degree. The patient appeared to be with bilaterally equal grip strength and Tinel and Phalen's maneuver were without increased pain of significant degree. Palpation over the region of the cervical and thoracic facet regions reproduce mild to moderate  discomfort. The patient was able to perform drop test without significant difficulty. There appeared to be unremarkable Spurling's maneuver. Palpation over the region of the lumbar region was with tenderness to palpation with straight leg raising tolerates approximately 30 without a definite increase of pain with dorsiflexion noted. There was tenderness over the lumbar facet lumbar paraspinal musculature region a moderate degree with lateral bending rotation extension and palpation of the lumbar facets reproducing moderate discomfort. There was moderate tenderness of the PSIS and PII S region as well as the gluteal and piriformis musculature region. The knees were with tenderness to palpation with negative anterior and posterior drawer signs without ballottement of the patella. No definite sensory deficit or dermatomal dystrophy she was detected. There was negative clonus negative Homans. Abdomen was nontender without excessive tenderness to palpation.      Assessment    Degenerative disc disease lumbar spine  L1-L2: No significant disc bulge. No evidence of neural foraminal stenosis. No central canal stenosis.  L2-L3: Mild broad-based disc bulge. Moderate right and mild left facet arthropathy. Mild right foraminal narrowing. No left foraminal narrowing. No central canal stenosis.  L3-L4: Mild broad-based disc bulge. Moderate bilateral facet arthropathy. No evidence of neural foraminal stenosis. No central canal stenosis.  L4-L5: Moderate-sized broad-based disc bulge. Severe bilateral facet arthropathy with ligamentum flavum infolding resulting in severe bilateral lateral recess stenosis and severe spinal stenosis. Mild left foraminal stenosis. No right foraminal stenosis.  L5-S1: No significant disc bulge. No evidence of neural foraminal stenosis. No central canal stenosis.  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 Please refer to  the enumerated sequence (series 3) prior to planning any intervention.  1. At L4-5 there is a moderate-size broad-based disc bulge. Severe bilateral facet arthropathy with ligamentum flavum  infolding resulting in severe bilateral lateral recess stenosis and severe spinal stenosis. Mild left foraminal stenosis.  Lumbar stenosis with neurogenic claudication  Lumbar facet syndrome  Sacroiliac joint dysfunction  Degenerative disc disease cervical spine  Post cervical surgery  Cervical facet syndrome  Diabetes mellitus with diabetic neuropathy       PLAN  Continue present medications Neurontin and hydrocodone acetaminophen  Recommend patient consider lumbar epidural steroid injection or lumbar facet, medial branch nerve blocks  F/U PCP at [facility] for evaluation of  BP and general medical  condition  F/U surgical evaluation. Plan to proceed with neurosurgical evaluation pending response to treatment  F/U neurological evaluation. May consider pending follow-up evaluations  May consider radiofrequency rhizolysis or intraspinal procedures pending response to present treatment and F/U evaluation   Patient to call Pain Management Center should patient have concerns prior to scheduled return appointment.

## 2016-01-10 NOTE — Progress Notes (Signed)
Safety precautions to be maintained throughout the outpatient stay will include: orient to surroundings, keep bed in low position, maintain call bell within reach at all times, provide assistance with transfer out of bed and ambulation.  

## 2016-01-10 NOTE — Patient Instructions (Addendum)
PLAN  Continue present medications Neurontin and hydrocodone acetaminophen  Recommend patient consider lumbar epidural steroid injection or lumbar facet, medial branch nerve blocks  F/U PCP at [facility] for evaluation of  BP and general medical  condition  F/U surgical evaluation. Plan to proceed with neurosurgical evaluation pending response to treatment  F/U neurological evaluation. May consider pending follow-up evaluations  May consider radiofrequency rhizolysis or intraspinal procedures pending response to present treatment and F/U evaluation   Patient to call [facility] should patient have concerns prior to scheduled return appointment.

## 2016-01-19 ENCOUNTER — Telehealth: Payer: Self-pay | Admitting: *Deleted

## 2016-01-19 NOTE — Telephone Encounter (Signed)
Patient called and informed of evaluation appt on 01-23-2016 at 11:45. Confirmed per this nurse.

## 2016-01-23 ENCOUNTER — Ambulatory Visit: Payer: Medicare Other | Attending: Pain Medicine | Admitting: Pain Medicine

## 2016-01-23 ENCOUNTER — Encounter: Payer: Self-pay | Admitting: Pain Medicine

## 2016-01-23 VITALS — BP 197/82 | HR 85 | Temp 97.9°F | Resp 20 | Ht 65.0 in | Wt 196.0 lb

## 2016-01-23 DIAGNOSIS — M533 Sacrococcygeal disorders, not elsewhere classified: Secondary | ICD-10-CM | POA: Diagnosis not present

## 2016-01-23 DIAGNOSIS — M47816 Spondylosis without myelopathy or radiculopathy, lumbar region: Secondary | ICD-10-CM

## 2016-01-23 DIAGNOSIS — E1140 Type 2 diabetes mellitus with diabetic neuropathy, unspecified: Secondary | ICD-10-CM | POA: Diagnosis not present

## 2016-01-23 DIAGNOSIS — M4806 Spinal stenosis, lumbar region: Secondary | ICD-10-CM | POA: Insufficient documentation

## 2016-01-23 DIAGNOSIS — M5030 Other cervical disc degeneration, unspecified cervical region: Secondary | ICD-10-CM | POA: Insufficient documentation

## 2016-01-23 DIAGNOSIS — Z9889 Other specified postprocedural states: Secondary | ICD-10-CM | POA: Diagnosis not present

## 2016-01-23 DIAGNOSIS — M1288 Other specific arthropathies, not elsewhere classified, other specified site: Secondary | ICD-10-CM | POA: Insufficient documentation

## 2016-01-23 DIAGNOSIS — E0841 Diabetes mellitus due to underlying condition with diabetic mononeuropathy: Secondary | ICD-10-CM

## 2016-01-23 DIAGNOSIS — M5136 Other intervertebral disc degeneration, lumbar region: Secondary | ICD-10-CM

## 2016-01-23 DIAGNOSIS — M545 Low back pain: Secondary | ICD-10-CM | POA: Diagnosis present

## 2016-01-23 MED ORDER — HYDROCODONE-ACETAMINOPHEN 5-325 MG PO TABS: ORAL_TABLET | ORAL | 0 refills | Status: DC

## 2016-01-23 NOTE — Patient Instructions (Signed)
PLAN  Continue present medications Neurontin and hydrocodone acetaminophen  Recommend patient consider lumbar epidural steroid injection or lumbar facet, medial branch nerve blocks as we previously discussed  F/U PCP at [facility] for evaluation of  BP and general medical  condition  F/U surgical evaluation. Plan to proceed with neurosurgical evaluation pending response to treatment  F/U neurological evaluation. May consider pending follow-up evaluations  May consider radiofrequency rhizolysis or intraspinal procedures pending response to present treatment and F/U evaluation   Patient to call [facility] Management Center should patient have concerns prior to scheduled return appointment.

## 2016-01-23 NOTE — Progress Notes (Signed)
Safety precautions to be maintained throughout the outpatient stay will include: orient to surroundings, keep bed in low position, maintain call bell within reach at all times, provide assistance with transfer out of bed and ambulation.  

## 2016-01-24 NOTE — Progress Notes (Signed)
The patient is a 65 year old female who returns to pain management for further evaluation and treatment of pain involving the lumbar lower extremity region with pain radiating to the left and right lower extremity regions with standing especially. The patient's pain is aggravated with activity as on the feet becoming more intense as the day progresses. Patient denies trauma change in events of daily living the call significant change in symptomatology. We will continue presently prescribed medications at this time as well as consider interventional treatment. At the present time we will observe response to medications and will consider patient for interventional treatment and adjustments of treatment regimen as discussed. All agreed to suggested treatment plan      Physical examination  There was tenderness of the splenius capitis and occipitalis region a mild degree with mild tenderness over the cervical and thoracic facet region without crepitus of the thoracic region noted. The patient appeared to be unremarkable Spurling's maneuver and perform drop test with moderate difficulty. Palpation over the lumbar paraspinal musculature region lumbar facet region was with tenderness to palpation of moderate degree with lateral bending rotation extension and palpation of the lumbar facets reproducing moderate discomfort. There was straight leg raising tolerates approximately 20 without increased pain with dorsiflexion noted. There was crepitus of the knees with negative anterior and posterior drawer signs without ballottement of the patella. There was tenderness over the PSIS and PII S regions a moderate degree with palpation of the greater trochanteric region iliotibial band region reproducing mild discomfort. There was no sensory deficit or dermatomal distribution detected. There was negative clonus negative Homans. Abdomen was nontender with no costovertebral tenderness  noted.    Assessment    Degenerative disc disease lumbar spine  L1-L2: No significant disc bulge. No evidence of neural foraminal stenosis. No central canal stenosis.  L2-L3: Mild broad-based disc bulge. Moderate right and mild left facet arthropathy. Mild right foraminal narrowing. No left foraminal narrowing. No central canal stenosis.  L3-L4: Mild broad-based disc bulge. Moderate bilateral facet arthropathy. No evidence of neural foraminal stenosis. No central canal stenosis.  L4-L5: Moderate-sized broad-based disc bulge. Severe bilateral facet arthropathy with ligamentum flavum infolding resulting in severe bilateral lateral recess stenosis and severe spinal stenosis. Mild left foraminal stenosis. No right foraminal stenosis.  L5-S1: No significant disc bulge. No evidence of neural foraminal stenosis. No central canal stenosis.  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 Please refer to the enumerated sequence (series 3) prior to planning any intervention.  1. At L4-5 there is a moderate-size broad-based disc bulge. Severe bilateral facet arthropathy with ligamentum flavum infolding resulting in severe bilateral lateral recess stenosis and severe spinal stenosis. Mild left foraminal stenosis.  Lumbar stenosis with neurogenic claudication  Lumbar facet syndrome  Sacroiliac joint dysfunction  Degenerative disc disease cervical spine  Post cervical surgery  Cervical facet syndrome  Diabetes mellitus with diabetic neuropathy      PLAN  Continue present medications Neurontin and hydrocodone acetaminophen  Recommend patient consider lumbar epidural steroid injection or lumbar facet, medial branch nerve blocks as we previously discussed  F/U PCP at [facility] for evaluation of  BP and general medical  condition  F/U surgical evaluation. Plan to proceed with neurosurgical evaluation pending response to  treatment  F/U neurological evaluation. May consider pending follow-up evaluations  May consider radiofrequency rhizolysis or intraspinal procedures pending response to present treatment and F/U evaluation   Patient to call [facility] Management Center should  patient have concerns prior to scheduled return appointment.

## 2016-02-06 ENCOUNTER — Encounter: Payer: Medicare Other | Admitting: Pain Medicine

## 2016-06-19 ENCOUNTER — Other Ambulatory Visit: Payer: Self-pay | Admitting: Specialist

## 2016-06-19 DIAGNOSIS — Z1231 Encounter for screening mammogram for malignant neoplasm of breast: Secondary | ICD-10-CM

## 2017-11-28 ENCOUNTER — Encounter: Payer: Self-pay | Admitting: Emergency Medicine

## 2017-11-28 ENCOUNTER — Emergency Department: Payer: Medicare HMO

## 2017-11-28 ENCOUNTER — Other Ambulatory Visit: Payer: Self-pay

## 2017-11-28 ENCOUNTER — Emergency Department
Admission: EM | Admit: 2017-11-28 | Discharge: 2017-11-29 | Disposition: A | Discharge status: Home / Self Care | Payer: Medicare HMO | Attending: Emergency Medicine | Admitting: Emergency Medicine

## 2017-11-28 DIAGNOSIS — J449 Chronic obstructive pulmonary disease, unspecified: Secondary | ICD-10-CM | POA: Diagnosis not present

## 2017-11-28 DIAGNOSIS — Z79899 Other long term (current) drug therapy: Secondary | ICD-10-CM | POA: Diagnosis not present

## 2017-11-28 DIAGNOSIS — R0789 Other chest pain: Secondary | ICD-10-CM | POA: Insufficient documentation

## 2017-11-28 DIAGNOSIS — I10 Essential (primary) hypertension: Secondary | ICD-10-CM

## 2017-11-28 DIAGNOSIS — E119 Type 2 diabetes mellitus without complications: Secondary | ICD-10-CM | POA: Insufficient documentation

## 2017-11-28 DIAGNOSIS — R079 Chest pain, unspecified: Secondary | ICD-10-CM

## 2017-11-28 DIAGNOSIS — Z7984 Long term (current) use of oral hypoglycemic drugs: Secondary | ICD-10-CM | POA: Diagnosis not present

## 2017-11-28 LAB — MAGNESIUM: Magnesium: 1.6 mg/dL — ABNORMAL LOW (ref 1.7–2.4)

## 2017-11-28 LAB — CBC
HCT: 35.1 % (ref 35.0–47.0)
Hemoglobin: 11.8 g/dL — ABNORMAL LOW (ref 12.0–16.0)
MCH: 29 pg (ref 26.0–34.0)
MCHC: 33.5 g/dL (ref 32.0–36.0)
MCV: 86.5 fL (ref 80.0–100.0)
PLATELETS: 310 10*3/uL (ref 150–440)
RBC: 4.05 MIL/uL (ref 3.80–5.20)
RDW: 13.7 % (ref 11.5–14.5)
WBC: 8.7 10*3/uL (ref 3.6–11.0)

## 2017-11-28 LAB — BASIC METABOLIC PANEL
Anion gap: 10 (ref 5–15)
BUN: 16 mg/dL (ref 8–23)
CHLORIDE: 99 mmol/L (ref 98–111)
CO2: 28 mmol/L (ref 22–32)
CREATININE: 0.93 mg/dL (ref 0.44–1.00)
Calcium: 8.8 mg/dL — ABNORMAL LOW (ref 8.9–10.3)
GFR calc non Af Amer: >60 mL/min (ref >60)
GLUCOSE: 250 mg/dL — AB (ref 70–99)
Potassium: 5 mmol/L (ref 3.5–5.1)
Sodium: 137 mmol/L (ref 135–145)

## 2017-11-28 LAB — TROPONIN I: Troponin I: <0.03 ng/mL (ref <0.03)

## 2017-11-28 MED ORDER — MAGNESIUM SULFATE 2 GM/50ML IV SOLN
2.0000 g | Freq: Once | INTRAVENOUS | Status: AC
  Administered 2017-11-28: 2 g via INTRAVENOUS
  Filled 2017-11-28: qty 50

## 2017-11-28 MED ORDER — ASPIRIN 81 MG PO CHEW
324.0000 mg | CHEWABLE_TABLET | Freq: Once | ORAL | Status: AC
  Administered 2017-11-28: 324 mg via ORAL
  Filled 2017-11-28: qty 4

## 2017-11-28 MED ORDER — LISINOPRIL 10 MG PO TABS
10.0000 mg | ORAL_TABLET | Freq: Once | ORAL | Status: AC
  Administered 2017-11-28: 10 mg via ORAL
  Filled 2017-11-28: qty 1

## 2017-11-28 NOTE — ED Provider Notes (Signed)
-----------------------------------------   11:01 PM on 11/28/2017 -----------------------------------------   Assuming care from Dr. Alfred Levins.  In short, Heidi Edwards is a 67 y.o. female with a chief complaint of chest pain.  Refer to the original H&P for additional details.  The current plan of care is to follow up repeat troponin which will be drawn at about 12:15am.   ----------------------------------------- 1:12 AM on 11/29/2017 -----------------------------------------  Second troponin negative.  Patient's blood pressure is come down.  She is in a good mood, watching TV with her family.  I gave her the update and they are ready to go home.  Discharging as per the paperwork prepared by Dr. Alfred Levins.   Hinda Kehr, MD 11/29/17 [phone]

## 2017-11-28 NOTE — ED Triage Notes (Signed)
Pt to triage via w/c with no distress noted; c/o left sided CP radiating into arm and back with no accomp symptoms; denies hx of same

## 2017-11-28 NOTE — ED Provider Notes (Signed)
[facility] Emergency Department Provider Note  ____________________________________________  Time seen: Approximately 9:44 PM  I have reviewed the triage vital signs and the nursing notes.   HISTORY  Chief Complaint Chest Pain   HPI Heidi Edwards is a 67 y.o. female history of COPD, GERD, diabetes, hypertension who presents for evaluation of chest pain.  Patient endorses several episodes of left-sided chest pain this evening while laying on the couch.  She reports the pain as pressure, located in the left side of her chest, radiating to her back, and associated with numbness of her left arm.  She reports chronic numbness of the left arm however he felt number during these episodes of pain.  Patient denies any pain at this time.  Each episode lasted just a few seconds and resolved without intervention.  She denies shortness of breath, nausea, vomiting associated with these episodes.  She reports that she feels slightly lightheaded at this time.  No personal or family history of blood clots, no recent travel immobilization, no leg pain or swelling, no hemoptysis, no exogenous hormones, no pleuritic sharp chest pain.  Patient has had one brother with a history of a heart attack.  She does have a history of COPD but denies any wheezing this evening.  She did have some wheezing this morning which resolved with the breathing treatment.  Past Medical History:  Diagnosis Date  . COPD (chronic obstructive pulmonary disease) ([place])   . Diabetes mellitus without complication ([place])   . GERD (gastroesophageal reflux disease)   . Hypertension     Patient Active Problem List   Diagnosis Date Noted  . DDD (degenerative disc disease), lumbar 12/13/2015  . Facet syndrome, lumbar 12/13/2015  . Diabetic neuropathy ([place]) 12/13/2015  . Type 2 diabetes mellitus ([place]) 06/20/2015  . Diabetes mellitus ([place]) 11/09/2014  . BP (high blood pressure) 11/09/2014  . HLD (hyperlipidemia)  11/09/2014  . Malignant neoplastic disease ([place]) 11/09/2014    Past Surgical History:  Procedure Laterality Date  . ABDOMINAL HYSTERECTOMY    . CARPAL TUNNEL RELEASE    . NECK SURGERY  2000  . SPINE SURGERY      Prior to Admission medications   Medication Sig Start Date End Date Taking? Authorizing Provider  amitriptyline (ELAVIL) 25 MG tablet Take 25 mg by mouth at bedtime.    [provider]  furosemide (LASIX) 20 MG tablet Take 10 mg by mouth.    [provider]  gabapentin (NEURONTIN) 300 MG capsule Take 300 mg by mouth 2 (two) times daily.    [provider]  glipiZIDE (GLUCOTROL) 10 MG tablet Take 10 mg by mouth daily before breakfast.    [provider]  HYDROcodone-acetaminophen (NORCO/VICODIN) 5-325 MG tablet Limit one half to one tab by mouth per day or 2-3 times per day if tolerated 01/23/16   Mohammed Kindle, MD  metFORMIN (GLUCOPHAGE) 1000 MG tablet Take 1,000 mg by mouth 2 (two) times daily with a meal.    [provider]  naproxen (EC NAPROSYN) 500 MG EC tablet Take 1 tablet (500 mg total) by mouth 2 (two) times daily with a meal. 09/10/15   Menshew, Dannielle Karvonen, PA-C  omeprazole (PRILOSEC) 40 MG capsule Take 40 mg by mouth daily.    [provider]  rosuvastatin (CRESTOR) 10 MG tablet Take 10 mg by mouth daily.    [provider]    Allergies Percocet [oxycodone-acetaminophen]  Family History  Problem Relation Age of Onset  .  Cancer Mother   . Cancer Father     Social History Smoking: never Alcohol: no Drugs: no  Review of Systems  Constitutional: Negative for fever. + Lightheadedness Eyes: Negative for visual changes. ENT: Negative for sore throat. Neck: No neck pain  Cardiovascular: + chest pain. Respiratory: Negative for shortness of breath. Gastrointestinal: Negative for abdominal pain, vomiting or diarrhea. Genitourinary: Negative for dysuria. Musculoskeletal: Negative for back  pain. Skin: Negative for rash. Neurological: Negative for headaches, weakness or numbness. Psych: No SI or HI  ____________________________________________   PHYSICAL EXAM:  VITAL SIGNS:  Vitals:   11/28/17 2148 11/28/17 2200  BP: (!) 176/82 (!) 189/90  Pulse: 93 (!) 106  Resp:  (!) 21  SpO2: 97% 96%   Constitutional: Alert and oriented. Well appearing and in no apparent distress. HEENT:      Head: Normocephalic and atraumatic.         Eyes: Conjunctivae are normal. Sclera is non-icteric.       Mouth/Throat: Mucous membranes are moist.       Neck: Supple with no signs of meningismus. Cardiovascular: Regular rate and rhythm. No murmurs, gallops, or rubs. 2+ symmetrical distal pulses are present in all extremities. No JVD. Respiratory: Normal respiratory effort. Lungs are clear to auscultation bilaterally. No wheezes, crackles, or rhonchi.  Gastrointestinal: Soft, non tender, and non distended with positive bowel sounds. No rebound or guarding. Musculoskeletal: Nontender with normal range of motion in all extremities. No edema, cyanosis, or erythema of extremities. Neurologic: Normal speech and language. Face is symmetric. Moving all extremities. No gross focal neurologic deficits are appreciated. Skin: Skin is warm, dry and intact. No rash noted. Psychiatric: Mood and affect are normal. Speech and behavior are normal.  ____________________________________________   LABS (all labs ordered are listed, but only abnormal results are displayed)  Labs Reviewed  BASIC METABOLIC PANEL - Abnormal; Notable for the following components:      Result Value   Glucose, Bld 250 (*)    Calcium 8.8 (*)    All other components within normal limits  CBC - Abnormal; Notable for the following components:   Hemoglobin 11.8 (*)    All other components within normal limits  MAGNESIUM - Abnormal; Notable for the following components:   Magnesium 1.6 (*)    All other components within normal  limits  TROPONIN I  TROPONIN I   ____________________________________________  EKG  ED ECG REPORT I, Rudene Re, the attending physician, personally viewed and interpreted this ECG.  Sinus tachycardia with several PACs, rate of 113, normal intervals, normal axis, no ST elevations or depressions.  PACs are new when compared to prior. ____________________________________________  RADIOLOGY  I have personally reviewed the images performed during this visit and I agree with the Radiologist's read.   Interpretation by Radiologist:  Dg Chest 2 View  Result Date: 11/28/2017 CLINICAL DATA:  Left-sided intermittent sharp chest pain radiating to the arm, back and breast. Pain started at 5-6 pain today. EXAM: CHEST - 2 VIEW COMPARISON:  04/13/2014 FINDINGS: Heart size is normal. There is mild-to-moderate atherosclerosis of the aortic arch without aneurysm. There is atelectasis at the left lung base. Small focus of airspace opacity seen projecting over the right lower lobe some which is due to overlap of ribs and pulmonary vasculature. A small focus of pneumonia and/or atelectasis is not entirely excluded. No effusion or pneumothorax. Degenerative change about the dorsal spine and both shoulders. IMPRESSION: Nonaneurysmal atherosclerotic aorta. Left basilar atelectasis. Small focus of airspace opacity  at the right lung base AP due to overlapping ribs and pulmonary vasculature. The possibility of a small focus of pneumonia and/or atelectasis is not entirely excluded. Electronically Signed   By: Ashley Royalty M.D.   On: 11/28/2017 21:42   ____________________________________________   PROCEDURES  Procedure(s) performed: None Procedures Critical Care performed:  None ____________________________________________   INITIAL IMPRESSION / ASSESSMENT AND PLAN / ED COURSE  67 y.o. female history of COPD, GERD, diabetes, hypertension who presents for evaluation of several intermittent short lived  episodes of L sided chest tightness this evening.  Patient is well-appearing, no distress, she has normal vital signs, her EKG shows several PACs but no evidence of ischemia or any other dysrhythmias.  Mag is low will replete. She has no swelling of her lower extremities, she is not on any exogenous hormones, symptoms are intermittent and have resolved at this time, therefore low clincical suspicion for PE. First troponin is negative, Heart score of 3, will monitor on telemetry and cycle troponin x 2 q3 hours. CXR most likely showing atelectasis with no cough, fever, or elevated WBC to make me concerned for PNA. No PTX on CXR. No neuro deficits or enlarged cardiac silhouette concerning for dissection. Patient's BP is elevated at this time but review of Epic shows that patient's BP has been persistently elevated for over 1 year. She is only taking lisinopril 10mg  at home, will increase to 20mg  and give an extra 10mg  dose here.     Clinical Course as of Nov 28 2237  Thu Nov 28, 2017  2229 Patient remains CP free and back to baseline. First troponin is negative. Plan to repeat troponin and EKG at 0015. Care transferred to Dr. Clearnce Hasten.   [CV]    Clinical Course User Index [CV] Alfred Levins [place], MD     As part of my medical decision making, I reviewed the following data within the [facility] notes reviewed and incorporated, Labs reviewed , EKG interpreted , Old EKG reviewed, Old chart reviewed, Patient signed out to Dr. Clearnce Hasten, Radiograph reviewed , Notes from prior ED visits and [place] Controlled Substance Database    Pertinent labs & imaging results that were available during my care of the patient were reviewed by me and considered in my medical decision making (see chart for details).    ____________________________________________   FINAL CLINICAL IMPRESSION(S) / ED DIAGNOSES  Final diagnoses:  Chest pain, unspecified type  Essential hypertension      NEW  MEDICATIONS STARTED DURING THIS VISIT:  ED Discharge Orders    None       Note:  This document was prepared using Dragon voice recognition software and may include unintentional dictation errors.    Rudene Re, MD 11/28/17 2239

## 2017-11-28 NOTE — Discharge Instructions (Signed)
You were seen for chest pain. Your workup today was reassuring. As I explained to you that does not mean that you do not have heart disease. You may need further evaluation to ensure you do not have a serious heart problem. Therefore it is imperative that you follow up with your doctor in 1-2 days for further evaluation.   Increase your lisinopril to 20mg  daily.   When should you call for help?  Call 911 if: You passed out (lost consciousness) or if you feel dizzy. You have difficulty breathing. You have symptoms of a heart attack. These may include: Chest pain or pressure, or a strange feeling in your chest. Indigestion. Sweating. Shortness of breath. Nausea or vomiting. Pain, pressure, or a strange feeling in your back, neck, jaw, or upper belly or in one or both shoulders or arms. Lightheadedness or sudden weakness. A fast or irregular heartbeat. After you call 911, the operator may tell you to chew 1 adult-strength or 2 to 4 low-dose aspirin. Wait for an ambulance. Do not try to drive yourself.   Call your doctor today if: You have any trouble breathing. Your chest pain gets worse. You are dizzy or lightheaded, or you feel like you may faint. You are not getting better as expected. You are having new or different chest pain  How can you care for yourself at home? Rest until you feel better. Take your medicine exactly as prescribed. Call your doctor if you think you are having a problem with your medicine. Do not drive after taking a prescription pain medicine.

## 2017-11-29 LAB — TROPONIN I: Troponin I: <0.03 ng/mL (ref <0.03)

## 2017-11-29 NOTE — ED Notes (Signed)

## 2022-01-24 ENCOUNTER — Ambulatory Visit: Payer: HMO | Admitting: Internal Medicine

## 2022-01-29 ENCOUNTER — Encounter: Payer: Self-pay | Admitting: *Deleted

## 2022-11-03 ENCOUNTER — Emergency Department: Payer: PPO

## 2022-11-03 ENCOUNTER — Inpatient Hospital Stay
Admission: EM | Admit: 2022-11-03 | Discharge: 2022-11-07 | DRG: 286 | Disposition: A | Discharge status: Home / Self Care | Payer: PPO | Attending: Internal Medicine | Admitting: Internal Medicine

## 2022-11-03 DIAGNOSIS — Z86010 Personal history of colonic polyps: Secondary | ICD-10-CM | POA: Diagnosis not present

## 2022-11-03 DIAGNOSIS — I110 Hypertensive heart disease with heart failure: Secondary | ICD-10-CM | POA: Diagnosis present

## 2022-11-03 DIAGNOSIS — I5031 Acute diastolic (congestive) heart failure: Secondary | ICD-10-CM | POA: Diagnosis present

## 2022-11-03 DIAGNOSIS — Z7984 Long term (current) use of oral hypoglycemic drugs: Secondary | ICD-10-CM

## 2022-11-03 DIAGNOSIS — I214 Non-ST elevation (NSTEMI) myocardial infarction: Secondary | ICD-10-CM | POA: Diagnosis present

## 2022-11-03 DIAGNOSIS — R7989 Other specified abnormal findings of blood chemistry: Secondary | ICD-10-CM

## 2022-11-03 DIAGNOSIS — M5030 Other cervical disc degeneration, unspecified cervical region: Secondary | ICD-10-CM | POA: Diagnosis present

## 2022-11-03 DIAGNOSIS — E669 Obesity, unspecified: Secondary | ICD-10-CM | POA: Diagnosis present

## 2022-11-03 DIAGNOSIS — Z87891 Personal history of nicotine dependence: Secondary | ICD-10-CM

## 2022-11-03 DIAGNOSIS — D649 Anemia, unspecified: Secondary | ICD-10-CM | POA: Diagnosis not present

## 2022-11-03 DIAGNOSIS — D519 Vitamin B12 deficiency anemia, unspecified: Secondary | ICD-10-CM | POA: Diagnosis present

## 2022-11-03 DIAGNOSIS — I10 Essential (primary) hypertension: Secondary | ICD-10-CM | POA: Diagnosis present

## 2022-11-03 DIAGNOSIS — E1169 Type 2 diabetes mellitus with other specified complication: Secondary | ICD-10-CM

## 2022-11-03 DIAGNOSIS — K219 Gastro-esophageal reflux disease without esophagitis: Secondary | ICD-10-CM | POA: Diagnosis present

## 2022-11-03 DIAGNOSIS — G894 Chronic pain syndrome: Secondary | ICD-10-CM | POA: Diagnosis present

## 2022-11-03 DIAGNOSIS — I2510 Atherosclerotic heart disease of native coronary artery without angina pectoris: Secondary | ICD-10-CM | POA: Diagnosis present

## 2022-11-03 DIAGNOSIS — J449 Chronic obstructive pulmonary disease, unspecified: Secondary | ICD-10-CM | POA: Diagnosis present

## 2022-11-03 DIAGNOSIS — Z79899 Other long term (current) drug therapy: Secondary | ICD-10-CM | POA: Diagnosis not present

## 2022-11-03 DIAGNOSIS — E782 Mixed hyperlipidemia: Secondary | ICD-10-CM | POA: Diagnosis not present

## 2022-11-03 DIAGNOSIS — Z6830 Body mass index (BMI) 30.0-30.9, adult: Secondary | ICD-10-CM

## 2022-11-03 DIAGNOSIS — I2489 Other forms of acute ischemic heart disease: Principal | ICD-10-CM | POA: Diagnosis present

## 2022-11-03 DIAGNOSIS — E785 Hyperlipidemia, unspecified: Secondary | ICD-10-CM | POA: Diagnosis present

## 2022-11-03 DIAGNOSIS — E1165 Type 2 diabetes mellitus with hyperglycemia: Secondary | ICD-10-CM

## 2022-11-03 DIAGNOSIS — D509 Iron deficiency anemia, unspecified: Secondary | ICD-10-CM | POA: Diagnosis present

## 2022-11-03 DIAGNOSIS — E119 Type 2 diabetes mellitus without complications: Secondary | ICD-10-CM | POA: Diagnosis present

## 2022-11-03 DIAGNOSIS — R079 Chest pain, unspecified: Principal | ICD-10-CM

## 2022-11-03 DIAGNOSIS — E7800 Pure hypercholesterolemia, unspecified: Secondary | ICD-10-CM | POA: Diagnosis not present

## 2022-11-03 LAB — CBC WITH DIFFERENTIAL/PLATELET
Abs Immature Granulocytes: 0.01 10*3/uL (ref 0.00–0.07)
Basophils Absolute: 0 10*3/uL (ref 0.0–0.1)
Basophils Relative: 1 %
Eosinophils Absolute: 0.2 10*3/uL (ref 0.0–0.5)
Eosinophils Relative: 3 %
HCT: 28.3 % — ABNORMAL LOW (ref 36.0–46.0)
Hemoglobin: 8.4 g/dL — ABNORMAL LOW (ref 12.0–15.0)
Immature Granulocytes: 0 %
Lymphocytes Relative: 19 %
Lymphs Abs: 1.3 10*3/uL (ref 0.7–4.0)
MCH: 24.6 pg — ABNORMAL LOW (ref 26.0–34.0)
MCHC: 29.7 g/dL — ABNORMAL LOW (ref 30.0–36.0)
MCV: 83 fL (ref 80.0–100.0)
Monocytes Absolute: 0.7 10*3/uL (ref 0.1–1.0)
Monocytes Relative: 10 %
Neutro Abs: 4.6 10*3/uL (ref 1.7–7.7)
Neutrophils Relative %: 67 %
Platelets: 371 10*3/uL (ref 150–400)
RBC: 3.41 MIL/uL — ABNORMAL LOW (ref 3.87–5.11)
RDW: 14.8 % (ref 11.5–15.5)
WBC: 6.8 10*3/uL (ref 4.0–10.5)
nRBC: 0 % (ref 0.0–0.2)

## 2022-11-03 LAB — COMPREHENSIVE METABOLIC PANEL
ALT: 10 U/L (ref 0–44)
AST: 21 U/L (ref 15–41)
Albumin: 3.9 g/dL (ref 3.5–5.0)
Alkaline Phosphatase: 56 U/L (ref 38–126)
Anion gap: 10 (ref 5–15)
BUN: 21 mg/dL (ref 8–23)
CO2: 26 mmol/L (ref 22–32)
Calcium: 8.7 mg/dL — ABNORMAL LOW (ref 8.9–10.3)
Chloride: 104 mmol/L (ref 98–111)
Creatinine, Ser: 1.15 mg/dL — ABNORMAL HIGH (ref 0.44–1.00)
GFR, Estimated: 50 mL/min — ABNORMAL LOW (ref >60)
Glucose, Bld: 87 mg/dL (ref 70–99)
Potassium: 5.2 mmol/L — ABNORMAL HIGH (ref 3.5–5.1)
Sodium: 140 mmol/L (ref 135–145)
Total Bilirubin: 0.9 mg/dL (ref 0.3–1.2)
Total Protein: 7.2 g/dL (ref 6.5–8.1)

## 2022-11-03 LAB — HEMOGLOBIN A1C
Hgb A1c MFr Bld: 8.1 % — ABNORMAL HIGH (ref 4.8–5.6)
Mean Plasma Glucose: 185.77 mg/dL

## 2022-11-03 LAB — TROPONIN I (HIGH SENSITIVITY)
Troponin I (High Sensitivity): 132 ng/L (ref <18)
Troponin I (High Sensitivity): 881 ng/L (ref <18)
Troponin I (High Sensitivity): 1950 ng/L (ref <18)

## 2022-11-03 LAB — PROTIME-INR
INR: 1.1 (ref 0.8–1.2)
Prothrombin Time: 14.4 seconds (ref 11.4–15.2)

## 2022-11-03 LAB — GLUCOSE, CAPILLARY: Glucose-Capillary: 77 mg/dL (ref 70–99)

## 2022-11-03 LAB — APTT: aPTT: 28 seconds (ref 24–36)

## 2022-11-03 LAB — BRAIN NATRIURETIC PEPTIDE: B Natriuretic Peptide: 52.5 pg/mL (ref 0.0–100.0)

## 2022-11-03 MED ORDER — ATORVASTATIN CALCIUM 20 MG PO TABS
40.0000 mg | ORAL_TABLET | Freq: Every day | ORAL | Status: DC
  Administered 2022-11-03 – 2022-11-04 (×2): 40 mg via ORAL
  Filled 2022-11-03 (×2): qty 2

## 2022-11-03 MED ORDER — ACETAMINOPHEN 325 MG PO TABS
650.0000 mg | ORAL_TABLET | ORAL | Status: DC | PRN
  Administered 2022-11-04 – 2022-11-05 (×2): 650 mg via ORAL
  Filled 2022-11-03 (×2): qty 2

## 2022-11-03 MED ORDER — INSULIN ASPART 100 UNIT/ML IJ SOLN
0.0000 [IU] | INTRAMUSCULAR | Status: DC
  Administered 2022-11-04: 2 [IU] via SUBCUTANEOUS
  Administered 2022-11-04: 1 [IU] via SUBCUTANEOUS
  Administered 2022-11-04: 2 [IU] via SUBCUTANEOUS
  Administered 2022-11-04: 1 [IU] via SUBCUTANEOUS
  Administered 2022-11-05: 2 [IU] via SUBCUTANEOUS
  Administered 2022-11-05: 1 [IU] via SUBCUTANEOUS
  Administered 2022-11-05: 3 [IU] via SUBCUTANEOUS
  Administered 2022-11-05: 1 [IU] via SUBCUTANEOUS
  Administered 2022-11-05 – 2022-11-07 (×5): 2 [IU] via SUBCUTANEOUS
  Administered 2022-11-07: 1 [IU] via SUBCUTANEOUS
  Administered 2022-11-07: 2 [IU] via SUBCUTANEOUS
  Filled 2022-11-03 (×14): qty 1

## 2022-11-03 MED ORDER — ASPIRIN 81 MG PO CHEW
324.0000 mg | CHEWABLE_TABLET | Freq: Once | ORAL | Status: AC
  Administered 2022-11-03: 324 mg via ORAL
  Filled 2022-11-03: qty 4

## 2022-11-03 MED ORDER — METOPROLOL TARTRATE 25 MG PO TABS
25.0000 mg | ORAL_TABLET | Freq: Two times a day (BID) | ORAL | Status: DC
  Administered 2022-11-03 – 2022-11-07 (×8): 25 mg via ORAL
  Filled 2022-11-03 (×8): qty 1

## 2022-11-03 MED ORDER — HEPARIN BOLUS VIA INFUSION
3450.0000 [IU] | Freq: Once | INTRAVENOUS | Status: AC
  Administered 2022-11-03: 3450 [IU] via INTRAVENOUS
  Filled 2022-11-03: qty 3450

## 2022-11-03 MED ORDER — MORPHINE SULFATE (PF) 4 MG/ML IV SOLN
4.0000 mg | INTRAVENOUS | Status: DC | PRN
  Filled 2022-11-03: qty 1

## 2022-11-03 MED ORDER — ONDANSETRON HCL 4 MG/2ML IJ SOLN: 4.0000 mg | Freq: Four times a day (QID) | INTRAMUSCULAR | Status: DC | PRN

## 2022-11-03 MED ORDER — SODIUM CHLORIDE 0.9 % IV SOLN: INTRAVENOUS | Status: DC

## 2022-11-03 MED ORDER — NITROGLYCERIN IN D5W 200-5 MCG/ML-% IV SOLN
0.0000 ug/min | INTRAVENOUS | Status: DC
  Administered 2022-11-03: 5 ug/min via INTRAVENOUS
  Filled 2022-11-03 (×2): qty 250

## 2022-11-03 MED ORDER — HEPARIN (PORCINE) 25000 UT/250ML-% IV SOLN
1250.0000 [IU]/h | INTRAVENOUS | Status: DC
  Administered 2022-11-03: 900 [IU]/h via INTRAVENOUS
  Administered 2022-11-04 – 2022-11-05 (×2): 1100 [IU]/h via INTRAVENOUS
  Filled 2022-11-03 (×3): qty 250

## 2022-11-03 MED ORDER — ALPRAZOLAM 0.25 MG PO TABS
0.2500 mg | ORAL_TABLET | Freq: Two times a day (BID) | ORAL | Status: DC | PRN
  Administered 2022-11-04 – 2022-11-06 (×4): 0.25 mg via ORAL
  Filled 2022-11-03 (×5): qty 1

## 2022-11-03 MED ORDER — MORPHINE SULFATE (PF) 2 MG/ML IV SOLN: 2.0000 mg | INTRAVENOUS | Status: DC | PRN

## 2022-11-03 NOTE — Assessment & Plan Note (Signed)
Hemoglobin 8.4 on presentation today Looks like baseline hemoglobin is around 12 No reported active episodes of bleeding May be confounder to STEMI Will check anemia panel Trend hemoglobin Transfuse for hemoglobin less than 7 Follow

## 2022-11-03 NOTE — Consult Note (Signed)
ANTICOAGULATION CONSULT NOTE - Initial Consult  Pharmacy Consult for Heparin Infusion  Indication: chest pain/ACS  Allergies  Allergen Reactions   Shellfish Allergy Nausea And Vomiting   Percocet [Oxycodone-Acetaminophen]     Patient Measurements: Height: 5\' 5"  (165.1 cm) Weight: 83.9 kg (185 lb) IBW/kg (Calculated) : 57 Heparin Dosing Weight: 75 kg  Vital Signs: BP: 176/68 (06/22 1451) Pulse Rate: 82 (06/22 1451)  Labs: Recent Labs    11/03/22 1504 11/03/22 1720  HGB 8.4*  --   HCT 28.3*  --   PLT 371  --   APTT  --  28  LABPROT  --  14.4  INR  --  1.1  CREATININE 1.15*  --   TROPONINIHS 132* 881*    Estimated Creatinine Clearance: 46.6 mL/min (A) (by C-G formula based on SCr of 1.15 mg/dL (H)).   Medical History: Past Medical History:  Diagnosis Date   COPD (chronic obstructive pulmonary disease) (H[facility])    Diabetes mellitus without complication ([facility])    GERD (gastroesophageal reflux disease)    Hypertension     Medications:  Scheduled:  Infusions:  PRN:   Assessment: Heidi Edwards is a 72 y.o. female presenting with chest pain. PMH significant for T2DM, HTN, DDD. Patient was not on [facility] PTA per chart review. Pharmacy has been consulted to initiate and manage heparin infusion.   Baseline Labs: aPTT 28, PT 14.4, INR 1.1, Hgb 8.4, Hct 28.3, Plt 371   Goal of Therapy:  Heparin level 0.3-0.7 units/ml Monitor platelets by anticoagulation protocol: Yes   Plan:  Give 3450 units bolus x 1 Start heparin infusion at 900 units/hr Check HL in 8 hours  Continue to monitor H&H and platelets daily while on heparin infusion   Celene Squibb, PharmD Clinical Pharmacist 11/03/2022 5:54 PM

## 2022-11-03 NOTE — Assessment & Plan Note (Signed)
SSI A1c 

## 2022-11-03 NOTE — ED Notes (Signed)
Advised nurse that patient has ready bed 

## 2022-11-03 NOTE — Progress Notes (Signed)
Plan of care discussed w/ Dr. Mayford Knife- on call cardiology earlier in the evening.  Will plan to consult in am  Please notify on call cardiology if chest pain recurs/persists despite IV regimen.

## 2022-11-03 NOTE — Assessment & Plan Note (Addendum)
Patient with typical chest pain since about this afternoon with central chest pressure and radiating up the neck and down the left arm Positive diaphoresis Symptomatically improved with aspirin nitroglycerin Troponin 130s EKG normal sinus rhythm Noted hemoglobin 8.4-may be a confounding issue with component of demand ischemia Heart score 6+ with noted risk factors including age, hyperlipidemia, hypertension, type 2 diabetes Positive mild anterior chest wall tenderness palpation Status post full dose aspirin Started on a heparin drip in the ER Will plan to admit for formal evaluation including restratification labs, 2D echo On-call cardiology consulted for formal evaluation

## 2022-11-03 NOTE — ED Provider Notes (Signed)
[facility] Provider Note   Event Date/Time   First MD Initiated Contact with Patient 11/03/22 1436     (approximate) History  No chief complaint on file.  HPI Heidi Edwards is a 72 y.o. female with a past medical history of type 2 diabetes, hypertension, hyperlipidemia who presents via EMS complaining of central chest pain that she describes as sharp, sudden onset while she was cooking and radiating to bilateral arms.  Of note, patient does endorse worsening pain with any movement however denies worsening with exertion.  Patient states that this pain did improve with a sublingual spray of nitroglycerin.  Patient has taken 81 mg aspirin prior to arrival.  Patient states that the chest pain has improved but is still a 6/10 in severity.  Patient states that she took a Vicodin prior to calling EMS as well and informs that she is on contract with a pain clinic currently.  ROS: Patient currently denies any vision changes, tinnitus, difficulty speaking, facial droop, sore throat, shortness of breath, abdominal pain, nausea/vomiting/diarrhea, dysuria, or weakness/numbness/paresthesias in any extremity   Physical Exam  Triage Vital Signs: ED Triage Vitals  Enc Vitals Group     BP      Pulse      Resp      Temp      Temp src      SpO2      Weight      Height      Head Circumference      Peak Flow      Pain Score      Pain Loc      Pain Edu?      Excl. in GC?    Most recent vital signs: There were no vitals filed for this visit. General: Awake, oriented x4. CV:  Good peripheral perfusion.  Resp:  Normal effort.  Abd:  No distention.  Other:  Elderly obese Caucasian female laying in bed in no acute distress. ED Results / Procedures / Treatments  Labs (all labs ordered are listed, but only abnormal results are displayed) Labs Reviewed - No data to display EKG ED ECG REPORT I, Heidi Edwards, the attending physician, personally viewed and interpreted this  ECG. Date: 11/03/2022 EKG Time:  Rate:  Rhythm: normal sinus rhythm QRS Axis: normal Intervals: normal ST/T Wave abnormalities: normal Narrative Interpretation: no evidence of acute ischemia RADIOLOGY ED MD interpretation: Pending -Agree with radiology assessment Official radiology report(s): No results found. PROCEDURES: Critical Care performed: No Procedures MEDICATIONS ORDERED IN ED: Medications - No data to display IMPRESSION / MDM / ASSESSMENT AND PLAN / ED COURSE  I reviewed the triage vital signs and the nursing notes.                             The patient is on the cardiac monitor to evaluate for evidence of arrhythmia and/or significant heart rate changes. Patient's presentation is most consistent with acute presentation with potential threat to life or bodily function. Patient is a 72 year old female who presents for substernal chest pain radiating to bilateral arms that occurred while cooking this evening and was mildly improved with sublingual nitroglycerin spray.  Patient is pending laboratory and radiologic evaluation at this time.  Care of this patient will be signed out to the oncoming physician at the end of my shift.  All pertinent patient information conveyed and all questions answered.  All further care  and disposition decisions will be made by the oncoming physician.   FINAL CLINICAL IMPRESSION(S) / ED DIAGNOSES   Final diagnoses:  None   Rx / DC Orders   ED Discharge Orders     None      Note:  This document was prepared using Dragon voice recognition software and may include unintentional dictation errors.   Heidi Katos, MD 11/04/22 Heidi Edwards

## 2022-11-03 NOTE — H&P (Addendum)
History and Physical    Patient: Heidi Edwards:[id number] DOB: [birth year] DOA: 11/03/2022 DOS: the patient was seen and examined on 11/03/2022 PCP: Selinda Michaels, MD  Patient coming from: Home  Chief Complaint:  Chief Complaint  Patient presents with   Chest Pain   HPI: Heidi Edwards is a 72 y.o. female with medical history significant of COPD, type 2 diabetes, hyperlipidemia, GERD, hypertension presenting with NSTEMI.  Patient reports developing moderate to severe chest pain earlier this afternoon while washing dishes.  Chest pain was 8 out of 10 in intensity.  Some radiation up the neck and down the left arm.  Mild nausea.  Minimal diaphoresis.  Minimal shortness of breath.  No abdominal pain.  No diarrhea.  No hemiparesis or confusion.  Patient denies any recent episodes like this in the past though she has had some intermittent episodes of shortness of breath with exertion recently.  Does report a remote history of cardiac catheterization several years ago in the [facility] system (though no visible documentation in seen in the chart).  Takes a baby aspirin every day.  Baseline hypertension and diabetes-fairly well-controlled.  No tobacco or alcohol use.  No reported family history of coronary disease.  Patient noted being given full dose aspirin as well as nitroglycerin with some symptomatic improvement. Presented to the ER afebrile, hemodynamically stable.  White count 6.8, hemoglobin 8.4, platelets 371, troponin 1 32.  EKG normal sinus rhythm.  Chest x-ray within normal limits.  Started on heparin drip in the ER. Review of Systems: As mentioned in the history of present illness. All other systems reviewed and are negative. Past Medical History:  Diagnosis Date   COPD (chronic obstructive pulmonary disease) (HCC)    Diabetes mellitus without complication (HCC)    GERD (gastroesophageal reflux disease)    Hypertension    Past Surgical History:  Procedure Laterality Date   ABDOMINAL  HYSTERECTOMY     CARPAL TUNNEL RELEASE     NECK SURGERY  2000   SPINE SURGERY     Social History:  reports that she has quit smoking. She has never used smokeless tobacco. No history on file for alcohol use and drug use.  Allergies  Allergen Reactions   Shellfish Allergy Nausea And Vomiting   Percocet [Oxycodone-Acetaminophen]     Family History  Problem Relation Age of Onset   Cancer Mother    Cancer Father     Prior to Admission medications   Medication Sig Start Date End Date Taking? Authorizing Provider  amitriptyline (ELAVIL) 25 MG tablet Take 25 mg by mouth at bedtime.   Yes [provider]  furosemide (LASIX) 20 MG tablet Take 10 mg by mouth.   Yes [provider]  gabapentin (NEURONTIN) 300 MG capsule Take 300 mg by mouth 2 (two) times daily.   Yes [provider]  glipiZIDE (GLUCOTROL) 10 MG tablet Take 10 mg by mouth daily before breakfast.   Yes [provider]  HYDROcodone-acetaminophen (NORCO/VICODIN) 5-325 MG tablet Limit one half to one tab by mouth per day or 2-3 times per day if tolerated 01/23/16  Yes Ewing Schlein, MD  metFORMIN (GLUCOPHAGE) 1000 MG tablet Take 1,000 mg by mouth 2 (two) times daily with a meal.   Yes [provider]  naproxen (EC NAPROSYN) 500 MG EC tablet Take 1 tablet (500 mg total) by mouth 2 (two) times daily with a meal. 09/10/15  Yes Menshew, Charlesetta Ivory, PA-C  omeprazole (PRILOSEC) 40 MG capsule  Take 40 mg by mouth daily.   Yes [provider]  rosuvastatin (CRESTOR) 10 MG tablet Take 10 mg by mouth daily.   Yes [provider]    Physical Exam: Vitals:   11/03/22 1445 11/03/22 1451 11/03/22 1453  BP:  (!) 176/68   Pulse:  82   Resp:  16   SpO2: 98% 96%   Weight:   83.9 kg  Height:   5\' 5"  (1.651 m)   Physical Exam Constitutional:      Appearance: She is obese.  HENT:     Head: Normocephalic and atraumatic.     Nose: Nose normal.     Mouth/Throat:     Mouth:  Mucous membranes are moist.  Cardiovascular:     Rate and Rhythm: Normal rate and regular rhythm.  Pulmonary:     Effort: Pulmonary effort is normal.  Abdominal:     General: Abdomen is flat. Bowel sounds are normal.  Musculoskeletal:        General: Normal range of motion.     Cervical back: Normal range of motion.  Skin:    General: Skin is warm.  Neurological:     General: No focal deficit present.  Psychiatric:        Mood and Affect: Mood normal.     Data Reviewed:  There are no new results to review at this time. DG Chest Port 1 View CLINICAL DATA:  Chest pain  EXAM: PORTABLE CHEST 1 VIEW  COMPARISON:  11/28/2017  FINDINGS: Mild cardiomegaly. Aortic atherosclerosis. Diffusely prominent interstitial markings. No pleural effusion or pneumothorax.  IMPRESSION: Mild cardiomegaly with diffusely prominent interstitial markings, which may reflect mild interstitial edema.  Electronically Signed   By: Duanne Guess D.O.   On: 11/03/2022 14:57  Lab Results  Component Value Date   WBC 6.8 11/03/2022   HGB 8.4 (L) 11/03/2022   HCT 28.3 (L) 11/03/2022   MCV 83.0 11/03/2022   PLT 371 11/03/2022   Last metabolic panel Lab Results  Component Value Date   GLUCOSE 87 11/03/2022   NA 140 11/03/2022   K 5.2 (H) 11/03/2022   CL 104 11/03/2022   CO2 26 11/03/2022   BUN 21 11/03/2022   CREATININE 1.15 (H) 11/03/2022   GFRNONAA 50 (L) 11/03/2022   CALCIUM 8.7 (L) 11/03/2022   PROT 7.2 11/03/2022   ALBUMIN 3.9 11/03/2022   BILITOT 0.9 11/03/2022   ALKPHOS 56 11/03/2022   AST 21 11/03/2022   ALT 10 11/03/2022   ANIONGAP 10 11/03/2022    Assessment and Plan: * NSTEMI (non-ST elevated myocardial infarction) [facility]) Patient with typical chest pain since about this afternoon with central chest pressure and radiating up the neck and down the left arm Positive diaphoresis Symptomatically improved with aspirin nitroglycerin Troponin 130s EKG normal sinus  rhythm Noted hemoglobin 8.4-may be a confounding issue with component of demand ischemia Heart score 6+ with noted risk factors including age, hyperlipidemia, hypertension, type 2 diabetes Positive mild anterior chest wall tenderness palpation Status post full dose aspirin Started on a heparin drip in the ER Will plan to admit for formal evaluation including restratification labs, 2D echo On-call cardiology consulted for formal evaluation  Anemia Hemoglobin 8.4 on presentation today Looks like baseline hemoglobin is around 12 No reported active episodes of bleeding May be confounder to STEMI Will check anemia panel Trend hemoglobin Transfuse for hemoglobin less than 7 Follow  Type 2 diabetes mellitus (HCC) SSI A1c  HLD (hyperlipidemia) Lipid panel pending Statin  BP (high blood pressure) BP stable Titrate home regimen.    Greater than 50% was spent in counseling and coordination of care with patient Total encounter time 80 minutes or more   Advance Care Planning:   Code Status: Full Code   Consults: Cardiology  Family Communication: Daughter at the bedside   Severity of Illness: The appropriate patient status for this patient is INPATIENT. Inpatient status is judged to be reasonable and necessary in order to provide the required intensity of service to ensure the patient's safety. The patient's presenting symptoms, physical exam findings, and initial radiographic and laboratory data in the context of their chronic comorbidities is felt to place them at high risk for further clinical deterioration. Furthermore, it is not anticipated that the patient will be medically stable for discharge from the hospital within 2 midnights of admission.   * I certify that at the point of admission it is my clinical judgment that the patient will require inpatient hospital care spanning beyond 2 midnights from the point of admission due to high intensity of service, high risk for further  deterioration and high frequency of surveillance required.*  Author: Floydene Flock, MD 11/03/2022 5:56 PM  For on call review [email or web address].

## 2022-11-03 NOTE — Assessment & Plan Note (Addendum)
Lipid panel pending Statin

## 2022-11-03 NOTE — Assessment & Plan Note (Signed)
BP stable Titrate home regimen 

## 2022-11-03 NOTE — ED Triage Notes (Addendum)
Pt comes from home for CP. Pt was cooking and felt sudden CP and numbness/tingling in both arms. Pt thought she got overheated by being over the stove. Pt has cardiac hx.  Pt is A&Ox4. Pt  took 81 mg of aspirin this morning and EMS gave pt one spray of nitroglycerin.

## 2022-11-04 ENCOUNTER — Inpatient Hospital Stay (HOSPITAL_COMMUNITY)
Admit: 2022-11-04 | Discharge: 2022-11-04 | Disposition: A | Discharge status: Home / Self Care | Payer: PPO | Attending: Family Medicine | Admitting: Family Medicine

## 2022-11-04 DIAGNOSIS — D509 Iron deficiency anemia, unspecified: Secondary | ICD-10-CM | POA: Insufficient documentation

## 2022-11-04 DIAGNOSIS — D649 Anemia, unspecified: Secondary | ICD-10-CM | POA: Diagnosis not present

## 2022-11-04 DIAGNOSIS — I214 Non-ST elevation (NSTEMI) myocardial infarction: Secondary | ICD-10-CM

## 2022-11-04 DIAGNOSIS — I10 Essential (primary) hypertension: Secondary | ICD-10-CM | POA: Diagnosis not present

## 2022-11-04 DIAGNOSIS — E7800 Pure hypercholesterolemia, unspecified: Secondary | ICD-10-CM

## 2022-11-04 LAB — IRON AND TIBC
Iron: 41 ug/dL (ref 28–170)
Saturation Ratios: 9 % — ABNORMAL LOW (ref 10.4–31.8)
TIBC: 454 ug/dL — ABNORMAL HIGH (ref 250–450)
UIBC: 413 ug/dL

## 2022-11-04 LAB — GLUCOSE, CAPILLARY
Glucose-Capillary: 138 mg/dL — ABNORMAL HIGH (ref 70–99)
Glucose-Capillary: 148 mg/dL — ABNORMAL HIGH (ref 70–99)
Glucose-Capillary: 155 mg/dL — ABNORMAL HIGH (ref 70–99)
Glucose-Capillary: 177 mg/dL — ABNORMAL HIGH (ref 70–99)
Glucose-Capillary: 204 mg/dL — ABNORMAL HIGH (ref 70–99)
Glucose-Capillary: 205 mg/dL — ABNORMAL HIGH (ref 70–99)

## 2022-11-04 LAB — CBC
HCT: 26.3 % — ABNORMAL LOW (ref 36.0–46.0)
Hemoglobin: 7.7 g/dL — ABNORMAL LOW (ref 12.0–15.0)
MCH: 24.2 pg — ABNORMAL LOW (ref 26.0–34.0)
MCHC: 29.3 g/dL — ABNORMAL LOW (ref 30.0–36.0)
MCV: 82.7 fL (ref 80.0–100.0)
Platelets: 323 10*3/uL (ref 150–400)
RBC: 3.18 MIL/uL — ABNORMAL LOW (ref 3.87–5.11)
RDW: 14.8 % (ref 11.5–15.5)
WBC: 7.3 10*3/uL (ref 4.0–10.5)
nRBC: 0 % (ref 0.0–0.2)

## 2022-11-04 LAB — ECHOCARDIOGRAM COMPLETE
AV Peak grad: 11.4 mmHg
Ao pk vel: 1.69 m/s
Area-P 1/2: 3.6 cm2
Height: 65 in
S' Lateral: 3.2 cm
Single Plane A4C EF: 57 %
Weight: 2960 oz

## 2022-11-04 LAB — BASIC METABOLIC PANEL
Anion gap: 9 (ref 5–15)
BUN: 22 mg/dL (ref 8–23)
CO2: 24 mmol/L (ref 22–32)
Calcium: 8.4 mg/dL — ABNORMAL LOW (ref 8.9–10.3)
Chloride: 105 mmol/L (ref 98–111)
Creatinine, Ser: 1.09 mg/dL — ABNORMAL HIGH (ref 0.44–1.00)
GFR, Estimated: 54 mL/min — ABNORMAL LOW (ref >60)
Glucose, Bld: 211 mg/dL — ABNORMAL HIGH (ref 70–99)
Potassium: 4.5 mmol/L (ref 3.5–5.1)
Sodium: 138 mmol/L (ref 135–145)

## 2022-11-04 LAB — FERRITIN: Ferritin: 5 ng/mL — ABNORMAL LOW (ref 11–307)

## 2022-11-04 LAB — HEPARIN LEVEL (UNFRACTIONATED)
Heparin Unfractionated: 0.16 IU/mL — ABNORMAL LOW (ref 0.30–0.70)
Heparin Unfractionated: 0.36 IU/mL (ref 0.30–0.70)
Heparin Unfractionated: 0.31 IU/mL (ref 0.30–0.70)

## 2022-11-04 LAB — LIPID PANEL
Cholesterol: 117 mg/dL (ref 0–200)
HDL: 35 mg/dL — ABNORMAL LOW (ref >40)
LDL Cholesterol: 64 mg/dL (ref 0–99)
Total CHOL/HDL Ratio: 3.3 RATIO
Triglycerides: 90 mg/dL (ref <150)
VLDL: 18 mg/dL (ref 0–40)

## 2022-11-04 LAB — TROPONIN I (HIGH SENSITIVITY): Troponin I (High Sensitivity): 2348 ng/L (ref <18)

## 2022-11-04 LAB — VITAMIN B12: Vitamin B-12: 177 pg/mL — ABNORMAL LOW (ref 180–914)

## 2022-11-04 MED ORDER — ATORVASTATIN CALCIUM 80 MG PO TABS
80.0000 mg | ORAL_TABLET | Freq: Every day | ORAL | Status: DC
  Administered 2022-11-05 – 2022-11-07 (×3): 80 mg via ORAL
  Filled 2022-11-04 (×3): qty 1

## 2022-11-04 MED ORDER — HYDROCODONE-ACETAMINOPHEN 7.5-325 MG PO TABS
1.0000 | ORAL_TABLET | ORAL | Status: DC | PRN
  Administered 2022-11-04 – 2022-11-07 (×8): 1 via ORAL
  Filled 2022-11-04 (×8): qty 1

## 2022-11-04 MED ORDER — ISOSORBIDE MONONITRATE ER 30 MG PO TB24
30.0000 mg | ORAL_TABLET | Freq: Every day | ORAL | Status: DC
  Administered 2022-11-04 – 2022-11-07 (×4): 30 mg via ORAL
  Filled 2022-11-04 (×4): qty 1

## 2022-11-04 MED ORDER — SODIUM CHLORIDE 0.9 % IV SOLN
200.0000 mg | Freq: Once | INTRAVENOUS | Status: AC
  Administered 2022-11-04: 200 mg via INTRAVENOUS
  Filled 2022-11-04: qty 200

## 2022-11-04 MED ORDER — CYANOCOBALAMIN 1000 MCG/ML IJ SOLN
1000.0000 ug | Freq: Every day | INTRAMUSCULAR | Status: DC
  Administered 2022-11-04 – 2022-11-07 (×4): 1000 ug via SUBCUTANEOUS
  Filled 2022-11-04 (×4): qty 1

## 2022-11-04 MED ORDER — HEPARIN BOLUS VIA INFUSION
2200.0000 [IU] | Freq: Once | INTRAVENOUS | Status: AC
  Administered 2022-11-04: 2200 [IU] via INTRAVENOUS
  Filled 2022-11-04: qty 2200

## 2022-11-04 NOTE — Progress Notes (Signed)
  Echocardiogram 2D Echocardiogram has been performed.  Lenor Coffin 11/04/2022, 9:14 AM

## 2022-11-04 NOTE — Consult Note (Signed)
ANTICOAGULATION CONSULT NOTE   Pharmacy Consult for Heparin Infusion  Indication: chest pain/ACS  Allergies  Allergen Reactions   Shellfish Allergy Nausea And Vomiting   Percocet [Oxycodone-Acetaminophen]     Patient Measurements: Height: 5\' 5"  (165.1 cm) Weight: 83.9 kg (185 lb) IBW/kg (Calculated) : 57 Heparin Dosing Weight: 75 kg  Vital Signs: Temp: 98.1 F (36.7 C) (06/23 1102) Temp Source: Oral (06/23 1102) BP: 145/53 (06/23 1102) Pulse Rate: 65 (06/23 1102)  Labs: Recent Labs    11/03/22 1504 11/03/22 1720 11/03/22 1943 11/04/22 0112 11/04/22 0551 11/04/22 0956 11/04/22 1255 11/04/22 1745  HGB 8.4*  --   --   --  7.7*  --   --   --   HCT 28.3*  --   --   --  26.3*  --   --   --   PLT 371  --   --   --  323  --   --   --   APTT  --  28  --   --   --   --   --   --   LABPROT  --  14.4  --   --   --   --   --   --   INR  --  1.1  --   --   --   --   --   --   HEPARINUNFRC  --   --   --  0.16*  --  0.36  --  0.31  CREATININE 1.15*  --   --  1.09*  --   --   --   --   TROPONINIHS 132* 881* 1,950*  --   --   --  2,348*  --      Estimated Creatinine Clearance: 49.2 mL/min (A) (by C-G formula based on SCr of 1.09 mg/dL (H)).   Medical History: Past Medical History:  Diagnosis Date   COPD (chronic obstructive pulmonary disease) (HCC)    Diabetes mellitus without complication (HCC)    GERD (gastroesophageal reflux disease)    Hypertension     Medications:  Scheduled:  Infusions:  PRN:   Assessment: Heidi Edwards is a 72 y.o. female presenting with chest pain. PMH significant for T2DM, HTN, DDD. Patient was not on [facility] PTA per chart review. Trops elevated 1.9K. Pharmacy has been consulted to initiate and manage heparin infusion.   6/23 0112 HL 0.16, subtherapeutic @ 900 u/hr 6/23 0956 HL 0.36, therapeutic x 1 6/23 1745 HL 0.31, therapeutic x 2  Goal of Therapy:  Heparin level 0.3-0.7 units/ml Monitor platelets by anticoagulation protocol: Yes     Plan:  Heparin level is therapeutic x 2. Will continue heparin infusion at 1100 units/hr. Recheck heparin level with AM labs. CBC daily while on heparin.   Clovia Cuff, PharmD, BCPS 11/04/2022 6:14 PM

## 2022-11-04 NOTE — Consult Note (Signed)
ANTICOAGULATION CONSULT NOTE   Pharmacy Consult for Heparin Infusion  Indication: chest pain/ACS  Allergies  Allergen Reactions   Shellfish Allergy Nausea And Vomiting   Percocet [Oxycodone-Acetaminophen]     Patient Measurements: Height: 5\' 5"  (165.1 cm) Weight: 83.9 kg (185 lb) IBW/kg (Calculated) : 57 Heparin Dosing Weight: 75 kg  Vital Signs: Temp: 98.4 F (36.9 C) (06/23 0918) Temp Source: Oral (06/23 0830) BP: 149/68 (06/23 0918) Pulse Rate: 69 (06/23 0918)  Labs: Recent Labs    11/03/22 1504 11/03/22 1720 11/03/22 1943 11/04/22 0112 11/04/22 0551 11/04/22 0956  HGB 8.4*  --   --   --  7.7*  --   HCT 28.3*  --   --   --  26.3*  --   PLT 371  --   --   --  323  --   APTT  --  28  --   --   --   --   LABPROT  --  14.4  --   --   --   --   INR  --  1.1  --   --   --   --   HEPARINUNFRC  --   --   --  0.16*  --  0.36  CREATININE 1.15*  --   --  1.09*  --   --   TROPONINIHS 132* 881* 1,950*  --   --   --      Estimated Creatinine Clearance: 49.2 mL/min (A) (by C-G formula based on SCr of 1.09 mg/dL (H)).   Medical History: Past Medical History:  Diagnosis Date   COPD (chronic obstructive pulmonary disease) (HCC)    Diabetes mellitus without complication (HCC)    GERD (gastroesophageal reflux disease)    Hypertension     Medications:  Scheduled:  Infusions:  PRN:   Assessment: Heidi Edwards is a 72 y.o. female presenting with chest pain. PMH significant for T2DM, HTN, DDD. Patient was not on [facility] PTA per chart review. Trops elevated 1.9K. Pharmacy has been consulted to initiate and manage heparin infusion.   6/23 0112 HL 0.16, subtherapeutic @ 900 u/hr 6/23 0956 HL 0.36  Goal of Therapy:  Heparin level 0.3-0.7 units/ml Monitor platelets by anticoagulation protocol: Yes    Plan:  Heparin level is therapeutic. Will continue heparin infusion at 1100 units/hr. Recheck heparin level in 8 hours. CBC daily while on heparin.   Ronnald Ramp,  PharmD Clinical Pharmacist 11/04/2022 10:31 AM

## 2022-11-04 NOTE — Progress Notes (Addendum)
Progress Note    Heidi Edwards  BJY:[id number] DOB: Dec 11, 1950  DOA: 11/03/2022 PCP: Selinda Michaels, MD      Brief Narrative:    Medical records reviewed and are as summarized below:  Heidi Edwards is a 72 y.o. female with medical history significant for hypertension, type II DM, obesity, COPD, GERD, degenerative disc disease of the cervical spine, chronic pain syndrome, who presented to the hospital with chest pain.  She said she had been cooking all day at home and the room temperature was up to about 17 F so she decided to turn on the [facility]. Troponins went from 130-1,950.  She was admitted to the hospital for acute NSTEMI.  She was treated with IV heparin drip.      Assessment/Plan:   Principal Problem:   NSTEMI (non-ST elevated myocardial infarction) (HCC) Active Problems:   Vitamin B12 deficiency anemia   BP (high blood pressure)   HLD (hyperlipidemia)   Type 2 diabetes mellitus (HCC)   Iron deficiency anemia    Body mass index is 30.79 kg/m.   Acute NSTEMI: Continue IV heparin drip.  Monitor heparin level per protocol.  IV nitroglycerin drip has been discontinued.  Continue aspirin, metoprolol and Lipitor.  2D echo is pending.  Follow-up with cardiologist. Discontinue IV fluids   Type II DM: Metformin present-held.  Use NovoLog as needed for hyperglycemia.   Hypertension, continue metoprolol.   Hyperlipidemia: Continue Lipitor   Normocytic anemia, iron deficiency anemia:  Hemoglobin is down to 7.7.  Hemoglobin was 8.4 on admission.   Iron 41, TIBC 454, iron saturation 9 and ferritin 5.  Vitamin B12 177. Given 1 dose of IV iron sucrose.  Start vitamin B12 injections. She has also been informed that she will need endoscopic workup.  She said she last had a colonoscopy many years ago (over 10 years)    Diet Order             Diet Carb Modified Fluid consistency: Thin; Room service appropriate? Yes  Diet effective now                             Consultants: Cardiologist  Procedures: None    Medications:    [START ON 11/05/2022] atorvastatin  80 mg Oral Daily   insulin aspart  0-6 Units Subcutaneous Q4H   isosorbide mononitrate  30 mg Oral Daily   metoprolol tartrate  25 mg Oral BID   Continuous Infusions:  heparin 1,100 Units/hr (11/04/22 1332)   iron sucrose       Anti-infectives (From admission, onward)    None              Family Communication/Anticipated D/C date and plan/Code Status   DVT prophylaxis: SCDs Start: 11/03/22 1741     Code Status: Full Code  Family Communication: Plan discussed with her brother at the bedside Disposition Plan: Plan to discharge home in 2 to 3 days   Status is: Inpatient Remains inpatient appropriate because: Acute NSTEMI       Subjective:   Interval events noted.  Chest pain has improved.  No shortness of breath, palpitations or dizziness.  Objective:    Vitals:   11/04/22 0300 11/04/22 0830 11/04/22 0918 11/04/22 1102  BP: (!) 127/47 (!) 143/50 (!) 149/68 (!) 145/53  Pulse: 71 73 69 65  Resp: 18 18 16 16   Temp: 98 F (36.7 C) 98.3 F (36.8 C)  98.4 F (36.9 C) 98.1 F (36.7 C)  TempSrc: Oral Oral  Oral  SpO2: 98% 98% 98% 98%  Weight:      Height:       No data found.   Intake/Output Summary (Last 24 hours) at 11/04/2022 1624 Last data filed at 11/04/2022 1439 Gross per 24 hour  Intake 898.07 ml  Output 0 ml  Net 898.07 ml   Filed Weights   11/03/22 1453  Weight: 83.9 kg    Exam:  GEN: NAD SKIN: No rash EYES: EOMI ENT: MMM CV: RRR PULM: CTA B ABD: soft, obese, NT, +BS CNS: AAO x 3, non focal EXT: No edema or tenderness        Data Reviewed:   I have personally reviewed following labs and imaging studies:  Labs: Labs show the following:   Basic Metabolic Panel: Recent Labs  Lab 11/03/22 1504 11/04/22 0112  NA 140 138  K 5.2* 4.5  CL 104 105  CO2 26 24  GLUCOSE 87 211*  BUN 21 22   CREATININE 1.15* 1.09*  CALCIUM 8.7* 8.4*   GFR Estimated Creatinine Clearance: 49.2 mL/min (A) (by C-G formula based on SCr of 1.09 mg/dL (H)). Liver Function Tests: Recent Labs  Lab 11/03/22 1504  AST 21  ALT 10  ALKPHOS 56  BILITOT 0.9  PROT 7.2  ALBUMIN 3.9   No results for input(s): "LIPASE", "AMYLASE" in the last 168 hours. No results for input(s): "AMMONIA" in the last 168 hours. Coagulation profile Recent Labs  Lab 11/03/22 1720  INR 1.1    CBC: Recent Labs  Lab 11/03/22 1504 11/04/22 0551  WBC 6.8 7.3  NEUTROABS 4.6  --   HGB 8.4* 7.7*  HCT 28.3* 26.3*  MCV 83.0 82.7  PLT 371 323   Cardiac Enzymes: No results for input(s): "CKTOTAL", "CKMB", "CKMBINDEX", "TROPONINI" in the last 168 hours. BNP (last 3 results) No results for input(s): "PROBNP" in the last 8760 hours. CBG: Recent Labs  Lab 11/03/22 1958 11/04/22 0012 11/04/22 0354 11/04/22 0828 11/04/22 1110  GLUCAP 77 177* 148* 138* 205*   D-Dimer: No results for input(s): "DDIMER" in the last 72 hours. Hgb A1c: Recent Labs    11/03/22 1854  HGBA1C 8.1*   Lipid Profile: Recent Labs    11/04/22 0551  CHOL 117  HDL 35*  LDLCALC 64  TRIG 90  CHOLHDL 3.3   Thyroid function studies: No results for input(s): "TSH", "T4TOTAL", "T3FREE", "THYROIDAB" in the last 72 hours.  Invalid input(s): "FREET3" Anemia work up: Recent Labs    11/04/22 0951  VITAMINB12 177*  FERRITIN 5*  TIBC 454*  IRON 41   Sepsis Labs: Recent Labs  Lab 11/03/22 1504 11/04/22 0551  WBC 6.8 7.3    Microbiology No results found for this or any previous visit (from the past 240 hour(s)).  Procedures and diagnostic studies:  DG Chest Port 1 View  Result Date: 11/03/2022 CLINICAL DATA:  Chest pain EXAM: PORTABLE CHEST 1 VIEW COMPARISON:  11/28/2017 FINDINGS: Mild cardiomegaly. Aortic atherosclerosis. Diffusely prominent interstitial markings. No pleural effusion or pneumothorax. IMPRESSION: Mild  cardiomegaly with diffusely prominent interstitial markings, which may reflect mild interstitial edema. Electronically Signed   By: Duanne Guess D.O.   On: 11/03/2022 14:57               LOS: 1 day   Ramiz Turpin  [facility]   Pager on [email or web address]. If 7PM-7AM, please contact night-coverage at [email or web address]     11/04/2022,  4:24 PM

## 2022-11-04 NOTE — Consult Note (Signed)
ANTICOAGULATION CONSULT NOTE - Initial Consult  Pharmacy Consult for Heparin Infusion  Indication: chest pain/ACS  Allergies  Allergen Reactions   Shellfish Allergy Nausea And Vomiting   Percocet [Oxycodone-Acetaminophen]     Patient Measurements: Height: 5\' 5"  (165.1 cm) Weight: 83.9 kg (185 lb) IBW/kg (Calculated) : 57 Heparin Dosing Weight: 75 kg  Vital Signs: Temp: 98.1 F (36.7 C) (06/23 0011) BP: 152/53 (06/23 0011) Pulse Rate: 69 (06/23 0011)  Labs: Recent Labs    11/03/22 1504 11/03/22 1720 11/03/22 1943 11/04/22 0112  HGB 8.4*  --   --   --   HCT 28.3*  --   --   --   PLT 371  --   --   --   APTT  --  28  --   --   LABPROT  --  14.4  --   --   INR  --  1.1  --   --   HEPARINUNFRC  --   --   --  0.16*  CREATININE 1.15*  --   --  1.09*  TROPONINIHS 132* 881* 1,950*  --      Estimated Creatinine Clearance: 49.2 mL/min (A) (by C-G formula based on SCr of 1.09 mg/dL (H)).   Medical History: Past Medical History:  Diagnosis Date   COPD (chronic obstructive pulmonary disease) (HCC)    Diabetes mellitus without complication (HCC)    GERD (gastroesophageal reflux disease)    Hypertension     Medications:  Scheduled:  Infusions:  PRN:   Assessment: Heidi Edwards is a 72 y.o. female presenting with chest pain. PMH significant for T2DM, HTN, DDD. Patient was not on [facility] PTA per chart review. Pharmacy has been consulted to initiate and manage heparin infusion.   Baseline Labs: aPTT 28, PT 14.4, INR 1.1, Hgb 8.4, Hct 28.3, Plt 371   Goal of Therapy:  Heparin level 0.3-0.7 units/ml Monitor platelets by anticoagulation protocol: Yes   6/23 0112 HL 0.16, subtherapeutic @ 900 u/hr  Plan:  Heparin level subtherapeutic Give heparin 2200 units IV x 1 Increase heparin infusion rate to 1100 units/hr Check heparin level 8 hours after rate change Daily CBC while on heparin  Barrie Folk, PharmD Clinical Pharmacist 11/04/2022 1:42 AM

## 2022-11-04 NOTE — Consult Note (Signed)
Cardiology Consultation:   Patient ID: Heidi Edwards; [id number]; 1950-07-23   Admit date: 11/03/2022 Date of Consult: 11/04/2022  Primary Care Provider: Selinda Michaels, MD Primary Cardiologist: New - consult by Dr. Mayford Knife Primary Electrophysiologist:  None   Patient Profile:   Heidi Edwards is a 72 y.o. female with a hx of DM2, COPD with prior tobacco use quitting 40 years ago with an approximate 60-year pack history, HTN, HLD, lumbar spondylosis and degenerative disc disease, hiatal hernia, and GERD who is being seen today for the evaluation of NSTEMI at the request of Dr. Myriam Forehand.  History of Present Illness:   [name] has no previously known cardiac history.  Remote nuclear stress test in 2011 demonstrated a minimal fixed defect at the apex without evidence of reversible ischemia or wall motion abnormality with an EF of 77%.  Stress echo through [facility] in 2013 was normal.  She reports an approximate 63-month history of exertional fatigue and shortness of breath.  She was admitted to [facility] on 11/03/2022 with development of substernal chest pain earlier that afternoon while cooking that radiated down the left arm.  Pain was associated with shortness of breath.  In total, chest pain lasted for approximately 60 minutes followed by spontaneous resolution.  Never had pain similar to this.  Hemodynamically stable with BP ranging from 1 13-1 76 systolic with heart rates in the 60s to 80s bpm, respirations 16-20, oxygen saturations mid to upper 90s on room air, and afebrile.  EKG showed NSR, 86 bpm, occasional PACs, no acute ST-T changes with possible prior anteroseptal infarct versus lead placement.  Initial high-sensitivity troponin 132 with a delta troponin of 881, currently trended to 1950.  BNP 52.  Initial potassium 5.2 trending to 4.5.  BUN/SCR 21/1.5 trended to 22/1.09.  Initial hemoglobin 8.4 trending to 7.7 (most recent prior Hgb of 11.8 from 2020).  Chest x-ray with mild cardiomegaly with  diffusely prominent interstitial markings.  In the ED, she received aspirin 324 mg x 1, and was placed on IV fluid, nitro, and heparin drips.  Currently chest pain-free.  She denies any melena, hematochezia, hematuria, hemoptysis, or hematemesis.  Notes headache with nitro drip.  Otherwise, is without complaint at this time.   Past Medical History:  Diagnosis Date   COPD (chronic obstructive pulmonary disease) (HCC)    Diabetes mellitus without complication (HCC)    GERD (gastroesophageal reflux disease)    Hypertension     Past Surgical History:  Procedure Laterality Date   ABDOMINAL HYSTERECTOMY     CARPAL TUNNEL RELEASE     NECK SURGERY  2000   SPINE SURGERY       Home Meds: Prior to Admission medications   Medication Sig Start Date End Date Taking? Authorizing Provider  amitriptyline (ELAVIL) 25 MG tablet Take 25 mg by mouth at bedtime.   Yes [provider]  furosemide (LASIX) 20 MG tablet Take 10 mg by mouth.   Yes [provider]  gabapentin (NEURONTIN) 300 MG capsule Take 300 mg by mouth 2 (two) times daily.   Yes [provider]  glipiZIDE (GLUCOTROL) 10 MG tablet Take 10 mg by mouth daily before breakfast.   Yes [provider]  HYDROcodone-acetaminophen (NORCO/VICODIN) 5-325 MG tablet Limit one half to one tab by mouth per day or 2-3 times per day if tolerated 01/23/16  Yes Ewing Schlein, MD  metFORMIN (GLUCOPHAGE) 1000 MG tablet Take 1,000 mg by mouth 2 (two) times daily with a meal.  Yes [provider]  naproxen (EC NAPROSYN) 500 MG EC tablet Take 1 tablet (500 mg total) by mouth 2 (two) times daily with a meal. 09/10/15  Yes Menshew, Charlesetta Ivory, PA-C  omeprazole (PRILOSEC) 40 MG capsule Take 40 mg by mouth daily.   Yes [provider]  rosuvastatin (CRESTOR) 10 MG tablet Take 10 mg by mouth daily.   Yes [provider]    Inpatient Medications: Scheduled Meds:  [START ON 11/05/2022] atorvastatin  80 mg  Oral Daily   insulin aspart  0-6 Units Subcutaneous Q4H   isosorbide mononitrate  30 mg Oral Daily   metoprolol tartrate  25 mg Oral BID   Continuous Infusions:  sodium chloride 50 mL/hr at 11/04/22 0300   heparin 1,100 Units/hr (11/04/22 0300)   PRN Meds: acetaminophen, ALPRAZolam, morphine injection, ondansetron (ZOFRAN) IV  Allergies:   Allergies  Allergen Reactions   Shellfish Allergy Nausea And Vomiting   Percocet [Oxycodone-Acetaminophen]     Social History:   Social History   Socioeconomic History   Marital status: Married    Spouse name: Not on file   Number of children: Not on file   Years of education: Not on file   Highest education level: Not on file  Occupational History   Not on file  Tobacco Use   Smoking status: Former   Smokeless tobacco: Never  Substance and Sexual Activity   Alcohol use: Not on file   Drug use: Not on file   Sexual activity: Not on file  Other Topics Concern   Not on file  Social History Narrative   Not on file   Social Determinants of Health   Financial Resource Strain: Not on file  Food Insecurity: Not on file  Transportation Needs: Not on file  Physical Activity: Not on file  Stress: Not on file  Social Connections: Not on file  Intimate Partner Violence: Not on file     Family History:   Family History  Problem Relation Age of Onset   Cancer Mother    Cancer Father     ROS:  Review of Systems  Constitutional:  Positive for malaise/fatigue. Negative for chills, diaphoresis, fever and weight loss.  HENT:  Negative for congestion.   Eyes:  Negative for discharge and redness.  Respiratory:  Positive for shortness of breath. Negative for cough, hemoptysis, sputum production and wheezing.   Cardiovascular:  Positive for chest pain. Negative for palpitations, orthopnea, claudication, leg swelling and PND.  Gastrointestinal:  Negative for abdominal pain, blood in stool, heartburn, melena, nausea and vomiting.   Genitourinary:  Negative for hematuria.  Musculoskeletal:  Negative for falls and myalgias.  Skin:  Negative for rash.  Neurological:  Positive for weakness. Negative for dizziness, tingling, tremors, sensory change, speech change, focal weakness and loss of consciousness.  Endo/Heme/Allergies:  Does not bruise/bleed easily.  Psychiatric/Behavioral:  Negative for substance abuse. The patient is not nervous/anxious.   All other systems reviewed and are negative.     Physical Exam/Data:   Vitals:   11/04/22 0300 11/04/22 0830 11/04/22 0918 11/04/22 1102  BP: (!) 127/47 (!) 143/50 (!) 149/68 (!) 145/53  Pulse: 71 73 69 65  Resp: 18 18 16 16   Temp: 98 F (36.7 C) 98.3 F (36.8 C) 98.4 F (36.9 C) 98.1 F (36.7 C)  TempSrc: Oral Oral  Oral  SpO2: 98% 98% 98% 98%  Weight:      Height:        Intake/Output Summary (  Last 24 hours) at 11/04/2022 1139 Last data filed at 11/04/2022 0300 Gross per 24 hour  Intake 798.07 ml  Output 0 ml  Net 798.07 ml   Filed Weights   11/03/22 1453  Weight: 83.9 kg   Body mass index is 30.79 kg/m.   Physical Exam: General: Well developed, well nourished, in no acute distress.  Pale appearing. Head: Normocephalic, atraumatic, sclera non-icteric, no xanthomas, nares without discharge.  Neck: Negative for carotid bruits. JVD not elevated. Lungs: Clear bilaterally to auscultation without wheezes, rales, or rhonchi. Breathing is unlabored. Heart: RRR with S1 S2. No murmurs, rubs, or gallops appreciated. Abdomen: Soft, non-tender, non-distended with normoactive bowel sounds. No hepatomegaly. No rebound/guarding. No obvious abdominal masses. Msk:  Strength and tone appear normal for age. Extremities: No clubbing or cyanosis. No edema. Distal pedal pulses are 2+ and equal bilaterally. Neuro: Alert and oriented X 3. No facial asymmetry. No focal deficit. Moves all extremities spontaneously. Psych:  Responds to questions appropriately with a normal  affect.   EKG:  The EKG was personally reviewed and demonstrates: NSR, 86 bpm, occasional PACs, no acute ST-T changes with possible prior anteroseptal infarct versus lead placement Telemetry:  Telemetry was personally reviewed and demonstrates: Sinus rhythm with rare PACs and PVCs  Weights: Filed Weights   11/03/22 1453  Weight: 83.9 kg    Relevant CV Studies:  Nuclear stress test 10/26/2009: 1.      Minimal fixed defect at the apex without any evidence of  reversible  ischemia or wall motion abnormality noted.  There is a good left  ventricular  function with ejection fraction 77%.  2.     Minimally abnormal stress test with a small apical defect.  __________  Stress echo 04/29/2012 ([facility]): Overall interpretation: Normal Stress Echocardiogram.  Laboratory Data:  Chemistry Recent Labs  Lab 11/03/22 1504 11/04/22 0112  NA 140 138  K 5.2* 4.5  CL 104 105  CO2 26 24  GLUCOSE 87 211*  BUN 21 22  CREATININE 1.15* 1.09*  CALCIUM 8.7* 8.4*  GFRNONAA 50* 54*  ANIONGAP 10 9    Recent Labs  Lab 11/03/22 1504  PROT 7.2  ALBUMIN 3.9  AST 21  ALT 10  ALKPHOS 56  BILITOT 0.9   Hematology Recent Labs  Lab 11/03/22 1504 11/04/22 0551  WBC 6.8 7.3  RBC 3.41* 3.18*  HGB 8.4* 7.7*  HCT 28.3* 26.3*  MCV 83.0 82.7  MCH 24.6* 24.2*  MCHC 29.7* 29.3*  RDW 14.8 14.8  PLT 371 323   Cardiac EnzymesNo results for input(s): "TROPONINI" in the last 168 hours. No results for input(s): "TROPIPOC" in the last 168 hours.  BNP Recent Labs  Lab 11/03/22 1504  BNP 52.5    DDimer No results for input(s): "DDIMER" in the last 168 hours.  Radiology/Studies:  DG Chest Port 1 View  Result Date: 11/03/2022 IMPRESSION: Mild cardiomegaly with diffusely prominent interstitial markings, which may reflect mild interstitial edema. Electronically Signed   By: Duanne Guess D.O.   On: 11/03/2022 14:57    Assessment and Plan:   1. NSTEMI:  -Currently chest pain-free   -High-sensitivity troponin has trended to 1950, cycle to peak  -ASA  -Currently on heparin drip, though will need close monitoring of hemoglobin as outlined below  -Obtain echo  -Recommend LHC once her anemia has been further evaluated and deemed to be stable  -Wean nitro drip  -Start Imdur 30 mg -As needed SL NTG and morphine   2. Normocytic  anemia:  -Presenting hemoglobin of 8.4 with most recent prior hemoglobin of 11.8 from 2020, currently trending to 7.7  -Reports a 61-month history of exertional fatigue and shortness of breath, chronicity of anemia is unclear at this time -Uncertain etiology, query dilutional component given IV fluids of hemoglobin this morning, though presenting hemoglobin was 8.4 -Recommend maintaining hemoglobin greater than 8  -Close monitoring on heparin drip, may need to discontinue  -Recommend anemia workup/GI evaluation prior to pursuing cardiac cath  3. HLD:  -LDL 64  -Titrate atorvastatin to 80 mg   4. DM2:  -A1c 8.1  -SSI per IM   5. HTN:  -Overall, blood pressure has been reasonably controlled  -Will likely need escalation of antihypertensive therapy with weaning of nitro drip  -Metoprolol with addition of Imdur       For questions or updates, please contact [facility] Please consult [email or web address] for contact info under Cardiology/STEMI.   Signed, Eula Listen, PA-C [facility] Pager: [phone] 11/04/2022, 11:39 AM

## 2022-11-05 DIAGNOSIS — I214 Non-ST elevation (NSTEMI) myocardial infarction: Secondary | ICD-10-CM | POA: Diagnosis not present

## 2022-11-05 LAB — CBC
HCT: 27.9 % — ABNORMAL LOW (ref 36.0–46.0)
Hemoglobin: 8.3 g/dL — ABNORMAL LOW (ref 12.0–15.0)
MCH: 24.3 pg — ABNORMAL LOW (ref 26.0–34.0)
MCHC: 29.7 g/dL — ABNORMAL LOW (ref 30.0–36.0)
MCV: 81.6 fL (ref 80.0–100.0)
Platelets: 351 10*3/uL (ref 150–400)
RBC: 3.42 MIL/uL — ABNORMAL LOW (ref 3.87–5.11)
RDW: 14.9 % (ref 11.5–15.5)
WBC: 8.4 10*3/uL (ref 4.0–10.5)
nRBC: 0 % (ref 0.0–0.2)

## 2022-11-05 LAB — HEPARIN LEVEL (UNFRACTIONATED): Heparin Unfractionated: 0.37 IU/mL (ref 0.30–0.70)

## 2022-11-05 LAB — BASIC METABOLIC PANEL
Anion gap: 7 (ref 5–15)
BUN: 18 mg/dL (ref 8–23)
CO2: 25 mmol/L (ref 22–32)
Calcium: 9.1 mg/dL (ref 8.9–10.3)
Chloride: 108 mmol/L (ref 98–111)
Creatinine, Ser: 0.97 mg/dL (ref 0.44–1.00)
GFR, Estimated: >60 mL/min (ref >60)
Glucose, Bld: 162 mg/dL — ABNORMAL HIGH (ref 70–99)
Potassium: 4.3 mmol/L (ref 3.5–5.1)
Sodium: 140 mmol/L (ref 135–145)

## 2022-11-05 LAB — MAGNESIUM: Magnesium: 2.1 mg/dL (ref 1.7–2.4)

## 2022-11-05 LAB — GLUCOSE, CAPILLARY
Glucose-Capillary: 150 mg/dL — ABNORMAL HIGH (ref 70–99)
Glucose-Capillary: 167 mg/dL — ABNORMAL HIGH (ref 70–99)
Glucose-Capillary: 170 mg/dL — ABNORMAL HIGH (ref 70–99)
Glucose-Capillary: 201 mg/dL — ABNORMAL HIGH (ref 70–99)
Glucose-Capillary: 249 mg/dL — ABNORMAL HIGH (ref 70–99)
Glucose-Capillary: 251 mg/dL — ABNORMAL HIGH (ref 70–99)

## 2022-11-05 MED ORDER — ASPIRIN 81 MG PO CHEW
81.0000 mg | CHEWABLE_TABLET | Freq: Every day | ORAL | Status: DC
  Administered 2022-11-05 – 2022-11-07 (×2): 81 mg via ORAL
  Filled 2022-11-05 (×3): qty 1

## 2022-11-05 MED ORDER — SODIUM CHLORIDE 0.9 % IV SOLN
200.0000 mg | Freq: Once | INTRAVENOUS | Status: AC
  Administered 2022-11-05: 200 mg via INTRAVENOUS
  Filled 2022-11-05: qty 200

## 2022-11-05 MED ORDER — MORPHINE SULFATE (PF) 2 MG/ML IV SOLN: 2.0000 mg | INTRAVENOUS | Status: DC | PRN

## 2022-11-05 MED ORDER — SODIUM CHLORIDE 0.9% FLUSH
3.0000 mL | Freq: Two times a day (BID) | INTRAVENOUS | Status: DC
  Administered 2022-11-05 – 2022-11-06 (×2): 3 mL via INTRAVENOUS

## 2022-11-05 MED ORDER — SODIUM CHLORIDE 0.9 % IV SOLN: INTRAVENOUS | Status: DC

## 2022-11-05 MED ORDER — SODIUM CHLORIDE 0.9% FLUSH: 3.0000 mL | INTRAVENOUS | Status: DC | PRN

## 2022-11-05 MED ORDER — SODIUM CHLORIDE 0.9 % IV SOLN: 250.0000 mL | INTRAVENOUS | Status: DC | PRN

## 2022-11-05 MED ORDER — ACETAMINOPHEN 325 MG PO TABS: 650.0000 mg | ORAL_TABLET | ORAL | Status: DC | PRN

## 2022-11-05 MED ORDER — ASPIRIN 81 MG PO CHEW
81.0000 mg | CHEWABLE_TABLET | ORAL | Status: AC
  Administered 2022-11-06: 81 mg via ORAL
  Filled 2022-11-05: qty 1

## 2022-11-05 NOTE — Progress Notes (Signed)
 Rounding Note    Patient Name: Heidi Edwards Date of Encounter: 11/05/2022  [facility] Cardiologist: New  Subjective   Hgb 8.3 this AM. Patient denies any bleeding. No further chest pain. She reports chronic SOB.   Inpatient Medications    Scheduled Meds:  atorvastatin  80 mg Oral Daily   cyanocobalamin  1,000 mcg Subcutaneous Daily   insulin aspart  0-6 Units Subcutaneous Q4H   isosorbide mononitrate  30 mg Oral Daily   metoprolol tartrate  25 mg Oral BID   Continuous Infusions:  heparin 1,100 Units/hr (11/04/22 1332)   PRN Meds: acetaminophen, ALPRAZolam, HYDROcodone-acetaminophen, morphine injection, ondansetron (ZOFRAN) IV   Vital Signs    Vitals:   11/04/22 1102 11/04/22 2020 11/05/22 0026 11/05/22 0411  BP: (!) 145/53 (!) 157/73 (!) 123/44 (!) 131/48  Pulse: 65 71 (!) 57 63  Resp: 16 20 18 18  Temp: 98.1 F (36.7 C) 98.4 F (36.9 C) 98.4 F (36.9 C) 98 F (36.7 C)  TempSrc: Oral Oral Oral Oral  SpO2: 98% 99% 97% 96%  Weight:      Height:        Intake/Output Summary (Last 24 hours) at 11/05/2022 0745 Last data filed at 11/04/2022 1938 Gross per 24 hour  Intake 340 ml  Output --  Net 340 ml      11/03/2022    2:53 PM 11/28/2017    9:11 PM 01/23/2016    1:00 PM  Last 3 Weights  Weight (lbs) 185 lb 196 lb 196 lb  Weight (kg) 83.915 kg 88.905 kg 88.905 kg      Telemetry    NSR brief SVT/PVCs - Personally Reviewed  ECG    No new - Personally Reviewed  Physical Exam   GEN: No acute distress.   Neck: No JVD Cardiac: RRR, no murmurs, rubs, or gallops.  Respiratory: Clear to auscultation bilaterally. GI: Soft, nontender, non-distended  MS: No edema; No deformity. Neuro:  Nonfocal  Psych: Normal affect   Labs    High Sensitivity Troponin:   Recent Labs  Lab 11/03/22 1504 11/03/22 1720 11/03/22 1943 11/04/22 1255  TROPONINIHS 132* 881* 1,950* 2,348*     Chemistry Recent Labs  Lab 11/03/22 1504 11/04/22 0112  11/05/22 0427  NA 140 138 140  K 5.2* 4.5 4.3  CL 104 105 108  CO2 26 24 25  GLUCOSE 87 211* 162*  BUN 21 22 18  CREATININE 1.15* 1.09* 0.97  CALCIUM 8.7* 8.4* 9.1  MG  --   --  2.1  PROT 7.2  --   --   ALBUMIN 3.9  --   --   AST 21  --   --   ALT 10  --   --   ALKPHOS 56  --   --   BILITOT 0.9  --   --   GFRNONAA 50* 54* >60  ANIONGAP 10 9 7    Lipids  Recent Labs  Lab 11/04/22 0551  CHOL 117  TRIG 90  HDL 35*  LDLCALC 64  CHOLHDL 3.3    Hematology Recent Labs  Lab 11/03/22 1504 11/04/22 0551 11/05/22 0427  WBC 6.8 7.3 8.4  RBC 3.41* 3.18* 3.42*  HGB 8.4* 7.7* 8.3*  HCT 28.3* 26.3* 27.9*  MCV 83.0 82.7 81.6  MCH 24.6* 24.2* 24.3*  MCHC 29.7* 29.3* 29.7*  RDW 14.8 14.8 14.9  PLT 371 323 351   Thyroid No results for input(s): "TSH", "FREET4" in the last 168 hours.    BNP Recent Labs  Lab 11/03/22 1504  BNP 52.5    DDimer No results for input(s): "DDIMER" in the last 168 hours.   Radiology    ECHOCARDIOGRAM COMPLETE  Result Date: 11/04/2022    ECHOCARDIOGRAM REPORT   Patient Name:   Nola C Lord Date of Exam: 11/04/2022 Medical Rec #:  [id number]     Height:       65.0 in Accession #:    [id number]    Weight:       185.0 lb Date of Birth:  04/29/1951      BSA:          1.914 m Patient Age:    72 years      BP:           127/47 mmHg Patient Gender: F             HR:           73 bpm. Exam Location:  [facility] Procedure: 2D Echo Indications:     NSTEMI I21.4  History:         Patient has no prior history of Echocardiogram examinations.  Sonographer:     Tikeshia Johnson RDCS, FASE Referring Phys:  [id number] STEVEN J NEWTON Diagnosing Phys: Traci Turner MD  Sonographer Comments: Suboptimal parasternal window. Image acquisition challenging due to patient body habitus. IMPRESSIONS  1. Left ventricular ejection fraction, by estimation, is 55 to 60%. The left ventricle has normal function. The left ventricle has no regional wall motion abnormalities. Left ventricular diastolic  parameters were normal.  2. Right ventricular systolic function is normal. The right ventricular size is normal. Tricuspid regurgitation signal is inadequate for assessing PA pressure.  3. The interatrial septum is very thickened symmetrically in the mid portion. 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 Could consider TEE or cardiac MRI for further evaluation.  4. The mitral valve is normal in structure. Trivial mitral valve regurgitation. No evidence of mitral stenosis.  5. The aortic valve is tricuspid. Aortic valve regurgitation is not visualized. Aortic valve sclerosis/calcification is present, without any evidence of aortic stenosis.  6. The inferior vena cava is normal in size with greater than 50% respiratory variability, suggesting right atrial pressure of 3 mmHg. FINDINGS  Left Ventricle: Left ventricular ejection fraction, by estimation, is 55 to 60%. The left ventricle has normal function. The left ventricle has no regional wall motion abnormalities. The left ventricular internal cavity size was normal in size. There is  no left ventricular hypertrophy. Left ventricular diastolic parameters were normal. Normal left ventricular filling pressure. Right Ventricle: The right ventricular size is normal. No increase in right ventricular wall thickness. Right ventricular systolic function is normal. Tricuspid regurgitation signal is inadequate for assessing PA pressure. Left Atrium: Left atrial size was normal in size. Right Atrium: Right atrial size was normal in size. Pericardium: There is no evidence of pericardial effusion. Mitral Valve: The mitral valve is normal in structure. Trivial mitral valve regurgitation. No evidence of mitral valve stenosis. Tricuspid Valve: The tricuspid valve is normal in structure. Tricuspid valve regurgitation is not demonstrated. No evidence of tricuspid stenosis. Aortic Valve: The aortic valve is tricuspid. Aortic valve regurgitation is not  visualized. Aortic valve sclerosis/calcification is present, without any evidence of aortic stenosis. Aortic valve peak gradient measures 11.4 mmHg. Pulmonic Valve: The pulmonic valve was normal in structure. Pulmonic valve regurgitation is not visualized. No evidence of pulmonic stenosis. Aorta: The aortic root is normal in size and   structure. Venous: The inferior vena cava is normal in size with greater than 50% respiratory variability, suggesting right atrial pressure of 3 mmHg. IAS/Shunts: No atrial level shunt detected by color flow Doppler.  LEFT VENTRICLE PLAX 2D LVIDd:         4.60 cm     Diastology LVIDs:         3.20 cm     LV e' medial:    10.40 cm/s LV PW:         1.00 cm     LV E/e' medial:  10.8 LV IVS:        0.90 cm     LV e' lateral:   8.16 cm/s LVOT diam:     2.00 cm     LV E/e' lateral: 13.7 LVOT Area:     3.14 cm  LV Volumes (MOD) LV vol d, MOD A4C: 62.1 ml LV vol s, MOD A4C: 26.7 ml LV SV MOD A4C:     62.1 ml RIGHT VENTRICLE RV Basal diam:  2.60 cm RV S prime:     14.70 cm/s TAPSE (M-mode): 2.0 cm LEFT ATRIUM             Index        RIGHT ATRIUM          Index LA diam:        3.90 cm 2.04 cm/m   RA Area:     7.99 cm LA Vol (A2C):   74.4 ml 38.88 ml/m  RA Volume:   14.10 ml 7.37 ml/m LA Vol (A4C):   49.6 ml 25.92 ml/m LA Biplane Vol: 61.8 ml 32.29 ml/m  AORTIC VALVE AV Vmax:      169.00 cm/s AV Peak Grad: 11.4 mmHg  AORTA Ao Root diam: 2.50 cm Ao Asc diam:  2.40 cm MITRAL VALVE MV Area (PHT): 3.60 cm     SHUNTS MV Decel Time: 211 msec     Systemic Diam: 2.00 cm MV E velocity: 112.00 cm/s MV A velocity: 111.00 cm/s MV E/A ratio:  1.01 Traci Turner MD Electronically signed by Traci Turner MD Signature Date/Time: 11/04/2022/9:56:04 PM    Final (Updated)    DG Chest Port 1 View  Result Date: 11/03/2022 CLINICAL DATA:  Chest pain EXAM: PORTABLE CHEST 1 VIEW COMPARISON:  11/28/2017 FINDINGS: Mild cardiomegaly. Aortic atherosclerosis. Diffusely prominent interstitial markings. No pleural  effusion or pneumothorax. IMPRESSION: Mild cardiomegaly with diffusely prominent interstitial markings, which may reflect mild interstitial edema. Electronically Signed   By: Nicholas  Plundo D.O.   On: 11/03/2022 14:57    Cardiac Studies   Echo 11/04/22   1. Left ventricular ejection fraction, by estimation, is 55 to 60%. The  left ventricle has normal function. The left ventricle has no regional  wall motion abnormalities. Left ventricular diastolic parameters were  normal.   2. Right ventricular systolic function is normal. The right ventricular  size is normal. Tricuspid regurgitation signal is inadequate for assessing  PA pressure.   3. The interatrial septum is very thickened symmetrically in the mid  portion. 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 Could consider TEE or cardiac MRI for further evaluation.   4. The mitral valve is normal in structure. Trivial mitral valve  regurgitation. No evidence of mitral stenosis.   5. The aortic valve is tricuspid. Aortic valve regurgitation is not  visualized. Aortic valve sclerosis/calcification is present, without any  evidence of aortic stenosis.   6. The inferior vena cava   is normal in size with greater than 50%  respiratory variability, suggesting right atrial pressure of 3 mmHg.    Nuclear stress test 10/26/2009: 1.      Minimal fixed defect at the apex without any evidence of  reversible  ischemia or wall motion abnormality noted.  There is a good left  ventricular  function with ejection fraction 77%.  2.     Minimally abnormal stress test with a small apical defect.  __________   Stress echo 04/29/2012 ([facility]): Overall interpretation: Normal Stress Echocardiogram.  Patient Profile     72 y.o. female  with a hx of DM2, COPD with prior tobacco use quitting 40 years ago with an approximate 60-year pack history, HTN, HLD, lumbar spondylosis and degenerative disc disease, hiatal hernia, and GERD  who is being seen today for the evaluation of NSTEMI   Assessment & Plan    NSTEMI  - chest pain free - HS trop up to 2348 - IV heparin. 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    

## 2022-11-05 NOTE — Progress Notes (Addendum)
Progress Note    Heidi Edwards  ZOX:[id number] DOB: [birth year]  DOA: 11/03/2022 PCP: Selinda Michaels, MD      Brief Narrative:    Medical records reviewed and are as summarized below:  Heidi Edwards is a 72 y.o. female with medical history significant for hypertension, type II DM, obesity, COPD, GERD, degenerative disc disease of the cervical spine, chronic pain syndrome, who presented to the hospital with chest pain.  She said she had been cooking all day at home and the room temperature was up to about 52 F so she decided to turn on the [facility]. Troponins went from 130-2,348.  She was admitted to the hospital for acute NSTEMI.  She was treated with IV heparin drip.      Assessment/Plan:   Principal Problem:   NSTEMI (non-ST elevated myocardial infarction) (HCC) Active Problems:   Vitamin B12 deficiency anemia   BP (high blood pressure)   HLD (hyperlipidemia)   Type 2 diabetes mellitus (HCC)   Iron deficiency anemia    Body mass index is 30.79 kg/m.   Acute NSTEMI: Continue IV heparin drip.  Monitor heparin level per protocol.  Continue metoprolol and Lipitor.  2D echo showed EF estimated at 55 to 60%, normal LV diastolic parameters. ? lipomatous hypertrophy interatrial septum.  Follow-up with cardiologist. Discontinue IV fluids   Type II DM: Metformin on hold.  Use NovoLog as needed for hyperglycemia.   Hypertension, continue metoprolol.   Hyperlipidemia: Continue Lipitor   Normocytic anemia, iron deficiency anemia: H&H is stable.  Hemoglobin is up to 8.3.    Iron 41, TIBC 454, iron saturation 9 and ferritin 5.  Vitamin B12 177. S/p infusion with IV iron sucrose on 11/04/2022.  Repeat IV iron sucrose infusion today.  Continue daily vitamin B12 injections. She had a colonoscopy in 2018 which revealed colonic polyps (adenomatous polyp on pathology) and nonbleeding internal hemorrhoids. Cardiologist requested inpatient GI evaluation prior to cardiac  catheterization.  Consulted Dr. Mia Creek, gastroenterologist.    Diet Order             Diet Carb Modified Fluid consistency: Thin; Room service appropriate? Yes  Diet effective now                            Consultants: Cardiologist  Procedures: None    Medications:    atorvastatin  80 mg Oral Daily   cyanocobalamin  1,000 mcg Subcutaneous Daily   insulin aspart  0-6 Units Subcutaneous Q4H   isosorbide mononitrate  30 mg Oral Daily   metoprolol tartrate  25 mg Oral BID   Continuous Infusions:  heparin 1,100 Units/hr (11/04/22 1332)     Anti-infectives (From admission, onward)    None              Family Communication/Anticipated D/C date and plan/Code Status   DVT prophylaxis: SCDs Start: 11/03/22 1741     Code Status: Full Code  Family Communication: Plan discussed with her brother at the bedside Disposition Plan: Plan to discharge home in 2 to 3 days   Status is: Inpatient Remains inpatient appropriate because: Acute NSTEMI       Subjective:   Interval events noted.  No chest pain, shortness of breath, abdominal pain or rectal bleeding.  Objective:    Vitals:   11/04/22 2020 11/05/22 0026 11/05/22 0411 11/05/22 0814  BP: (!) 157/73 (!) 123/44 (!) 131/48 (!) 157/68  Pulse: 71 Marland Kitchen)  57 63 72  Resp: 20 18 18 16   Temp: 98.4 F (36.9 C) 98.4 F (36.9 C) 98 F (36.7 C) 98.4 F (36.9 C)  TempSrc: Oral Oral Oral Oral  SpO2: 99% 97% 96% 95%  Weight:      Height:       No data found.   Intake/Output Summary (Last 24 hours) at 11/05/2022 0824 Last data filed at 11/04/2022 1938 Gross per 24 hour  Intake 340 ml  Output --  Net 340 ml   Filed Weights   11/03/22 1453  Weight: 83.9 kg    Exam:  GEN: NAD SKIN: Warm and dry EYES: No pallor or icterus ENT: MMM CV: RRR PULM: CTA B ABD: soft, obese, NT, +BS CNS: AAO x 3, non focal EXT: No edema or tenderness       Data Reviewed:   I have personally  reviewed following labs and imaging studies:  Labs: Labs show the following:   Basic Metabolic Panel: Recent Labs  Lab 11/03/22 1504 11/04/22 0112 11/05/22 0427  NA 140 138 140  K 5.2* 4.5 4.3  CL 104 105 108  CO2 26 24 25   GLUCOSE 87 211* 162*  BUN 21 22 18   CREATININE 1.15* 1.09* 0.97  CALCIUM 8.7* 8.4* 9.1  MG  --   --  2.1   GFR Estimated Creatinine Clearance: 55.3 mL/min (by C-G formula based on SCr of 0.97 mg/dL). Liver Function Tests: Recent Labs  Lab 11/03/22 1504  AST 21  ALT 10  ALKPHOS 56  BILITOT 0.9  PROT 7.2  ALBUMIN 3.9   No results for input(s): "LIPASE", "AMYLASE" in the last 168 hours. No results for input(s): "AMMONIA" in the last 168 hours. Coagulation profile Recent Labs  Lab 11/03/22 1720  INR 1.1    CBC: Recent Labs  Lab 11/03/22 1504 11/04/22 0551 11/05/22 0427  WBC 6.8 7.3 8.4  NEUTROABS 4.6  --   --   HGB 8.4* 7.7* 8.3*  HCT 28.3* 26.3* 27.9*  MCV 83.0 82.7 81.6  PLT 371 323 351   Cardiac Enzymes: No results for input(s): "CKTOTAL", "CKMB", "CKMBINDEX", "TROPONINI" in the last 168 hours. BNP (last 3 results) No results for input(s): "PROBNP" in the last 8760 hours. CBG: Recent Labs  Lab 11/04/22 1623 11/04/22 2023 11/05/22 0027 11/05/22 0410 11/05/22 0821  GLUCAP 155* 204* 201* 150* 170*   D-Dimer: No results for input(s): "DDIMER" in the last 72 hours. Hgb A1c: Recent Labs    11/03/22 1854  HGBA1C 8.1*   Lipid Profile: Recent Labs    11/04/22 0551  CHOL 117  HDL 35*  LDLCALC 64  TRIG 90  CHOLHDL 3.3   Thyroid function studies: No results for input(s): "TSH", "T4TOTAL", "T3FREE", "THYROIDAB" in the last 72 hours.  Invalid input(s): "FREET3" Anemia work up: Recent Labs    11/04/22 0951  VITAMINB12 177*  FERRITIN 5*  TIBC 454*  IRON 41   Sepsis Labs: Recent Labs  Lab 11/03/22 1504 11/04/22 0551 11/05/22 0427  WBC 6.8 7.3 8.4    Microbiology No results found for this or any previous  visit (from the past 240 hour(s)).  Procedures and diagnostic studies:  ECHOCARDIOGRAM COMPLETE  Result Date: 11/04/2022    ECHOCARDIOGRAM REPORT   Patient Name:   ACCALIA Edwards Date of Exam: 11/04/2022 Medical Rec #:  [id number]     Height:       65.0 in Accession #:    [id number]    Weight:  185.0 lb Date of Birth:  [birth year]      BSA:          1.914 m Patient Age:    72 years      BP:           127/47 mmHg Patient Gender: F             HR:           73 bpm. Exam Location:  [facility] Procedure: 2D Echo Indications:     NSTEMI I21.4  History:         Patient has no prior history of Echocardiogram examinations.  Sonographer:     Overton Mam RDCS, FASE Referring Phys:  [id number] Francoise Schaumann [place] Diagnosing Phys: Armanda Magic MD  Sonographer Comments: Suboptimal parasternal window. Image acquisition challenging due to patient body habitus. IMPRESSIONS  1. Left ventricular ejection fraction, by estimation, is 55 to 60%. The left ventricle has normal function. The left ventricle has no regional wall motion abnormalities. Left ventricular diastolic parameters were normal.  2. Right ventricular systolic function is normal. The right ventricular size is normal. Tricuspid regurgitation signal is inadequate for assessing PA pressure.  3. The interatrial septum is very thickened symmetrically in the mid portion. 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 Could consider TEE or cardiac MRI for further evaluation.  4. The mitral valve is normal in structure. Trivial mitral valve regurgitation. No evidence of mitral stenosis.  5. The aortic valve is tricuspid. Aortic valve regurgitation is not visualized. Aortic valve sclerosis/calcification is present, without any evidence of aortic stenosis.  6. The inferior vena cava is normal in size with greater than 50% respiratory variability, suggesting right atrial pressure of 3 mmHg. FINDINGS  Left Ventricle: Left ventricular ejection fraction, by  estimation, is 55 to 60%. The left ventricle has normal function. The left ventricle has no regional wall motion abnormalities. The left ventricular internal cavity size was normal in size. There is  no left ventricular hypertrophy. Left ventricular diastolic parameters were normal. Normal left ventricular filling pressure. Right Ventricle: The right ventricular size is normal. No increase in right ventricular wall thickness. Right ventricular systolic function is normal. Tricuspid regurgitation signal is inadequate for assessing PA pressure. Left Atrium: Left atrial size was normal in size. Right Atrium: Right atrial size was normal in size. Pericardium: There is no evidence of pericardial effusion. Mitral Valve: The mitral valve is normal in structure. Trivial mitral valve regurgitation. No evidence of mitral valve stenosis. Tricuspid Valve: The tricuspid valve is normal in structure. Tricuspid valve regurgitation is not demonstrated. No evidence of tricuspid stenosis. Aortic Valve: The aortic valve is tricuspid. Aortic valve regurgitation is not visualized. Aortic valve sclerosis/calcification is present, without any evidence of aortic stenosis. Aortic valve peak gradient measures 11.4 mmHg. Pulmonic Valve: The pulmonic valve was normal in structure. Pulmonic valve regurgitation is not visualized. No evidence of pulmonic stenosis. Aorta: The aortic root is normal in size and structure. Venous: The inferior vena cava is normal in size with greater than 50% respiratory variability, suggesting right atrial pressure of 3 mmHg. IAS/Shunts: No atrial level shunt detected by color flow Doppler.  LEFT VENTRICLE PLAX 2D LVIDd:         4.60 cm     Diastology LVIDs:         3.20 cm     LV e' medial:    10.40 cm/s LV PW:         1.00 cm  LV E/e' medial:  10.8 LV IVS:        0.90 cm     LV e' lateral:   8.16 cm/s LVOT diam:     2.00 cm     LV E/e' lateral: 13.7 LVOT Area:     3.14 cm  LV Volumes (MOD) LV vol d, MOD A4C:  62.1 ml LV vol s, MOD A4C: 26.7 ml LV SV MOD A4C:     62.1 ml RIGHT VENTRICLE RV Basal diam:  2.60 cm RV S prime:     14.70 cm/s TAPSE (M-mode): 2.0 cm LEFT ATRIUM             Index        RIGHT ATRIUM          Index LA diam:        3.90 cm 2.04 cm/m   RA Area:     7.99 cm LA Vol (A2C):   74.4 ml 38.88 ml/m  RA Volume:   14.10 ml 7.37 ml/m LA Vol (A4C):   49.6 ml 25.92 ml/m LA Biplane Vol: 61.8 ml 32.29 ml/m  AORTIC VALVE AV Vmax:      169.00 cm/s AV Peak Grad: 11.4 mmHg  AORTA Ao Root diam: 2.50 cm Ao Asc diam:  2.40 cm MITRAL VALVE MV Area (PHT): 3.60 cm     SHUNTS MV Decel Time: 211 msec     Systemic Diam: 2.00 cm MV E velocity: 112.00 cm/s MV A velocity: 111.00 cm/s MV E/A ratio:  1.01 Armanda Magic MD Electronically signed by Armanda Magic MD Signature Date/Time: 11/04/2022/9:56:04 PM    Final (Updated)    DG Chest Port 1 View  Result Date: 11/03/2022 CLINICAL DATA:  Chest pain EXAM: PORTABLE CHEST 1 VIEW COMPARISON:  11/28/2017 FINDINGS: Mild cardiomegaly. Aortic atherosclerosis. Diffusely prominent interstitial markings. No pleural effusion or pneumothorax. IMPRESSION: Mild cardiomegaly with diffusely prominent interstitial markings, which may reflect mild interstitial edema. Electronically Signed   By: Duanne Guess D.O.   On: 11/03/2022 14:57               LOS: 2 days   Kunta Hilleary  [facility]   Pager on [email or web address]. If 7PM-7AM, please contact night-coverage at [email or web address]     11/05/2022, 8:24 AM

## 2022-11-05 NOTE — H&P (View-Only) (Signed)
Rounding Note    Patient Name: Heidi Edwards Date of Encounter: 11/05/2022  [facility] Cardiologist: New  Subjective   Hgb 8.3 this AM. Patient denies any bleeding. No further chest pain. She reports chronic SOB.   Inpatient Medications    Scheduled Meds:  atorvastatin  80 mg Oral Daily   cyanocobalamin  1,000 mcg Subcutaneous Daily   insulin aspart  0-6 Units Subcutaneous Q4H   isosorbide mononitrate  30 mg Oral Daily   metoprolol tartrate  25 mg Oral BID   Continuous Infusions:  heparin 1,100 Units/hr (11/04/22 1332)   PRN Meds: acetaminophen, ALPRAZolam, HYDROcodone-acetaminophen, morphine injection, ondansetron (ZOFRAN) IV   Vital Signs    Vitals:   11/04/22 1102 11/04/22 2020 11/05/22 0026 11/05/22 0411  BP: (!) 145/53 (!) 157/73 (!) 123/44 (!) 131/48  Pulse: 65 71 (!) 57 63  Resp: 16 20 18 18   Temp: 98.1 F (36.7 C) 98.4 F (36.9 C) 98.4 F (36.9 C) 98 F (36.7 C)  TempSrc: Oral Oral Oral Oral  SpO2: 98% 99% 97% 96%  Weight:      Height:        Intake/Output Summary (Last 24 hours) at 11/05/2022 0745 Last data filed at 11/04/2022 1938 Gross per 24 hour  Intake 340 ml  Output --  Net 340 ml      11/03/2022    2:53 PM 11/28/2017    9:11 PM 01/23/2016    1:00 PM  Last 3 Weights  Weight (lbs) 185 lb 196 lb 196 lb  Weight (kg) 83.915 kg 88.905 kg 88.905 kg      Telemetry    NSR brief SVT/PVCs - Personally Reviewed  ECG    No new - Personally Reviewed  Physical Exam   GEN: No acute distress.   Neck: No JVD Cardiac: RRR, no murmurs, rubs, or gallops.  Respiratory: Clear to auscultation bilaterally. GI: Soft, nontender, non-distended  MS: No edema; No deformity. Neuro:  Nonfocal  Psych: Normal affect   Labs    High Sensitivity Troponin:   Recent Labs  Lab 11/03/22 1504 11/03/22 1720 11/03/22 1943 11/04/22 1255  TROPONINIHS 132* 881* 1,950* 2,348*     Chemistry Recent Labs  Lab 11/03/22 1504 11/04/22 0112  11/05/22 0427  NA 140 138 140  K 5.2* 4.5 4.3  CL 104 105 108  CO2 26 24 25   GLUCOSE 87 211* 162*  BUN 21 22 18   CREATININE 1.15* 1.09* 0.97  CALCIUM 8.7* 8.4* 9.1  MG  --   --  2.1  PROT 7.2  --   --   ALBUMIN 3.9  --   --   AST 21  --   --   ALT 10  --   --   ALKPHOS 56  --   --   BILITOT 0.9  --   --   GFRNONAA 50* 54* >60  ANIONGAP 10 9 7     Lipids  Recent Labs  Lab 11/04/22 0551  CHOL 117  TRIG 90  HDL 35*  LDLCALC 64  CHOLHDL 3.3    Hematology Recent Labs  Lab 11/03/22 1504 11/04/22 0551 11/05/22 0427  WBC 6.8 7.3 8.4  RBC 3.41* 3.18* 3.42*  HGB 8.4* 7.7* 8.3*  HCT 28.3* 26.3* 27.9*  MCV 83.0 82.7 81.6  MCH 24.6* 24.2* 24.3*  MCHC 29.7* 29.3* 29.7*  RDW 14.8 14.8 14.9  PLT 371 323 351   Thyroid No results for input(s): "TSH", "FREET4" in the last 168 hours.  BNP Recent Labs  Lab 11/03/22 1504  BNP 52.5    DDimer No results for input(s): "DDIMER" in the last 168 hours.   Radiology    ECHOCARDIOGRAM COMPLETE  Result Date: 11/04/2022    ECHOCARDIOGRAM REPORT   Patient Name:   Heidi Edwards Date of Exam: 11/04/2022 Medical Rec #:  [id number]     Height:       65.0 in Accession #:    [id number]    Weight:       185.0 lb Date of Birth:  [birth year]      BSA:          1.914 m Patient Age:    72 years      BP:           127/47 mmHg Patient Gender: F             HR:           73 bpm. Exam Location:  [facility] Procedure: 2D Echo Indications:     NSTEMI I21.4  History:         Patient has no prior history of Echocardiogram examinations.  Sonographer:     Overton Mam RDCS, FASE Referring Phys:  [id number] Francoise Schaumann [place] Diagnosing Phys: Armanda Magic MD  Sonographer Comments: Suboptimal parasternal window. Image acquisition challenging due to patient body habitus. IMPRESSIONS  1. Left ventricular ejection fraction, by estimation, is 55 to 60%. The left ventricle has normal function. The left ventricle has no regional wall motion abnormalities. Left ventricular diastolic  parameters were normal.  2. Right ventricular systolic function is normal. The right ventricular size is normal. Tricuspid regurgitation signal is inadequate for assessing PA pressure.  3. The interatrial septum is very thickened symmetrically in the mid portion. 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 Could consider TEE or cardiac MRI for further evaluation.  4. The mitral valve is normal in structure. Trivial mitral valve regurgitation. No evidence of mitral stenosis.  5. The aortic valve is tricuspid. Aortic valve regurgitation is not visualized. Aortic valve sclerosis/calcification is present, without any evidence of aortic stenosis.  6. The inferior vena cava is normal in size with greater than 50% respiratory variability, suggesting right atrial pressure of 3 mmHg. FINDINGS  Left Ventricle: Left ventricular ejection fraction, by estimation, is 55 to 60%. The left ventricle has normal function. The left ventricle has no regional wall motion abnormalities. The left ventricular internal cavity size was normal in size. There is  no left ventricular hypertrophy. Left ventricular diastolic parameters were normal. Normal left ventricular filling pressure. Right Ventricle: The right ventricular size is normal. No increase in right ventricular wall thickness. Right ventricular systolic function is normal. Tricuspid regurgitation signal is inadequate for assessing PA pressure. Left Atrium: Left atrial size was normal in size. Right Atrium: Right atrial size was normal in size. Pericardium: There is no evidence of pericardial effusion. Mitral Valve: The mitral valve is normal in structure. Trivial mitral valve regurgitation. No evidence of mitral valve stenosis. Tricuspid Valve: The tricuspid valve is normal in structure. Tricuspid valve regurgitation is not demonstrated. No evidence of tricuspid stenosis. Aortic Valve: The aortic valve is tricuspid. Aortic valve regurgitation is not  visualized. Aortic valve sclerosis/calcification is present, without any evidence of aortic stenosis. Aortic valve peak gradient measures 11.4 mmHg. Pulmonic Valve: The pulmonic valve was normal in structure. Pulmonic valve regurgitation is not visualized. No evidence of pulmonic stenosis. Aorta: The aortic root is normal in size and  structure. Venous: The inferior vena cava is normal in size with greater than 50% respiratory variability, suggesting right atrial pressure of 3 mmHg. IAS/Shunts: No atrial level shunt detected by color flow Doppler.  LEFT VENTRICLE PLAX 2D LVIDd:         4.60 cm     Diastology LVIDs:         3.20 cm     LV e' medial:    10.40 cm/s LV PW:         1.00 cm     LV E/e' medial:  10.8 LV IVS:        0.90 cm     LV e' lateral:   8.16 cm/s LVOT diam:     2.00 cm     LV E/e' lateral: 13.7 LVOT Area:     3.14 cm  LV Volumes (MOD) LV vol d, MOD A4C: 62.1 ml LV vol s, MOD A4C: 26.7 ml LV SV MOD A4C:     62.1 ml RIGHT VENTRICLE RV Basal diam:  2.60 cm RV S prime:     14.70 cm/s TAPSE (M-mode): 2.0 cm LEFT ATRIUM             Index        RIGHT ATRIUM          Index LA diam:        3.90 cm 2.04 cm/m   RA Area:     7.99 cm LA Vol (A2C):   74.4 ml 38.88 ml/m  RA Volume:   14.10 ml 7.37 ml/m LA Vol (A4C):   49.6 ml 25.92 ml/m LA Biplane Vol: 61.8 ml 32.29 ml/m  AORTIC VALVE AV Vmax:      169.00 cm/s AV Peak Grad: 11.4 mmHg  AORTA Ao Root diam: 2.50 cm Ao Asc diam:  2.40 cm MITRAL VALVE MV Area (PHT): 3.60 cm     SHUNTS MV Decel Time: 211 msec     Systemic Diam: 2.00 cm MV E velocity: 112.00 cm/s MV A velocity: 111.00 cm/s MV E/A ratio:  1.01 Armanda Magic MD Electronically signed by Armanda Magic MD Signature Date/Time: 11/04/2022/9:56:04 PM    Final (Updated)    DG Chest Port 1 View  Result Date: 11/03/2022 CLINICAL DATA:  Chest pain EXAM: PORTABLE CHEST 1 VIEW COMPARISON:  11/28/2017 FINDINGS: Mild cardiomegaly. Aortic atherosclerosis. Diffusely prominent interstitial markings. No pleural  effusion or pneumothorax. IMPRESSION: Mild cardiomegaly with diffusely prominent interstitial markings, which may reflect mild interstitial edema. Electronically Signed   By: Duanne Guess D.O.   On: 11/03/2022 14:57    Cardiac Studies   Echo 11/04/22   1. Left ventricular ejection fraction, by estimation, is 55 to 60%. The  left ventricle has normal function. The left ventricle has no regional  wall motion abnormalities. Left ventricular diastolic parameters were  normal.   2. Right ventricular systolic function is normal. The right ventricular  size is normal. Tricuspid regurgitation signal is inadequate for assessing  PA pressure.   3. The interatrial septum is very thickened symmetrically in the mid  portion. 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 Could consider TEE or cardiac MRI for further evaluation.   4. The mitral valve is normal in structure. Trivial mitral valve  regurgitation. No evidence of mitral stenosis.   5. The aortic valve is tricuspid. Aortic valve regurgitation is not  visualized. Aortic valve sclerosis/calcification is present, without any  evidence of aortic stenosis.   6. The inferior vena cava  is normal in size with greater than 50%  respiratory variability, suggesting right atrial pressure of 3 mmHg.    Nuclear stress test 10/26/2009: 1.      Minimal fixed defect at the apex without any evidence of  reversible  ischemia or wall motion abnormality noted.  There is a good left  ventricular  function with ejection fraction 77%.  2.     Minimally abnormal stress test with a small apical defect.  __________   Stress echo 04/29/2012 ([facility]): Overall interpretation: Normal Stress Echocardiogram.  Patient Profile     72 y.o. female  with a hx of DM2, COPD with prior tobacco use quitting 40 years ago with an approximate 60-year pack history, HTN, HLD, lumbar spondylosis and degenerative disc disease, hiatal hernia, and GERD  who is being seen today for the evaluation of NSTEMI   Assessment & Plan    NSTEMI  - chest pain free - HS trop up to 2348 - IV heparin. Hgb 8.4>7.7>8.3 - echo showed LVEF 55-60%, no WMA, abnormal interatrial septum recommending TEE or cMRI - plan for LHC once anemia has been further evaluated - off IV NItro  - Imdur 30mg  daily, Lopressor 25mg  BID, Lipitor 80mg  daily - ASA held with anemia  Iron def Anemia - Hgb 8.4 on admission - today Hgb 8.3 - prior Hgb 11.8 - IV iron ordered - recommend GI input prior to heart cath  Abnormal echo - echo showed LVEF 55-60%, no WMA, thickened interatrial septum possible atrial myxoma and lipomatous hypertrophy, trivial MR - recommending TEE or cMRI, may undergo this admission  HLD - LDL 64 - continue Lipitor 80mg  daily  DM2 - per IM  HTN - Bps reasonable  For questions or updates, please contact [facility] Please consult [email or web address] for contact info under        Signed, Latrelle Fuston David Stall, PA-C  11/05/2022, 7:45 AM

## 2022-11-05 NOTE — Consult Note (Signed)
ANTICOAGULATION CONSULT NOTE   Pharmacy Consult for Heparin Infusion  Indication: chest pain/ACS  Allergies  Allergen Reactions   Shellfish Allergy Nausea And Vomiting   Percocet [Oxycodone-Acetaminophen]     Patient Measurements: Height: 5\' 5"  (165.1 cm) Weight: 83.9 kg (185 lb) IBW/kg (Calculated) : 57 Heparin Dosing Weight: 75 kg  Vital Signs: Temp: 98.4 F (36.9 C) (06/24 0026) Temp Source: Oral (06/24 0026) BP: 123/44 (06/24 0026) Pulse Rate: 57 (06/24 0026)  Labs: Recent Labs    11/03/22 1504 11/03/22 1504 11/03/22 1720 11/03/22 1943 11/04/22 0112 11/04/22 0551 11/04/22 0956 11/04/22 1255 11/04/22 1745 11/05/22 0427  HGB 8.4*  --   --   --   --  7.7*  --   --   --  8.3*  HCT 28.3*  --   --   --   --  26.3*  --   --   --  27.9*  PLT 371  --   --   --   --  323  --   --   --  351  APTT  --   --  28  --   --   --   --   --   --   --   LABPROT  --   --  14.4  --   --   --   --   --   --   --   INR  --   --  1.1  --   --   --   --   --   --   --   HEPARINUNFRC  --    < >  --   --  0.16*  --  0.36  --  0.31 0.37  CREATININE 1.15*  --   --   --  1.09*  --   --   --   --  0.97  TROPONINIHS 132*  --  881* 1,950*  --   --   --  2,348*  --   --    < > = values in this interval not displayed.     Estimated Creatinine Clearance: 55.3 mL/min (by C-G formula based on SCr of 0.97 mg/dL).   Medical History: Past Medical History:  Diagnosis Date   COPD (chronic obstructive pulmonary disease) (HCC)    Diabetes mellitus without complication (HCC)    GERD (gastroesophageal reflux disease)    Hypertension     Medications:  Scheduled:  Infusions:  PRN:   Assessment: JHERI MITTER is a 72 y.o. female presenting with chest pain. PMH significant for T2DM, HTN, DDD. Patient was not on [facility] PTA per chart review. Trops elevated 1.9K. Pharmacy has been consulted to initiate and manage heparin infusion.   6/23 0112 HL 0.16, subtherapeutic @ 900 u/hr 6/23 0956 HL 0.36,  therapeutic x 1 6/23 1745 HL 0.31, therapeutic x 2 6/24 0427 HL 0.37, therapeutic x 2  Goal of Therapy:  Heparin level 0.3-0.7 units/ml Monitor platelets by anticoagulation protocol: Yes    Plan:  Heparin level is therapeutic x 3. Will continue heparin infusion at 1100 units/hr. Recheck heparin level with AM labs. CBC daily while on heparin.   Barrie Folk, PharmD 11/05/2022 5:18 AM

## 2022-11-05 NOTE — Consult Note (Signed)
Consultation  Referring Provider:   Hospitalist Admit date:  11/03/2022 Consult date: 11/05/2022         Reason for Consultation: IDA/B12              HPI:   Heidi Edwards is a 72 y.o. lady with history of COPD, GERD, and hypertension who presented to the hospital with chest pain and troponin elevation. Diagnosed with NSTEMI for which she was placed on heparin drip. Patient also diagnosed with iron deficiency anemia and severe b12 deficiency. The patient's last colonoscopy was in 2018 with three small Ta's with 3-5 year f/u recommended. She endorses GERD with daily PPI use. She used to smoke heavily but stopped 30 years ago. No alcohol use. No blood thinners. No NSAID use. No abdominal pain. No overt GI bleeding. No dysphagia.  PREVIOUS ENDOSCOPIES:            Colonoscopy in 2018  - Adequate prep  - Normal TI - Three small Ta's in sigmoid and descending colon  - IH   Past Medical History:  Diagnosis Date   COPD (chronic obstructive pulmonary disease) (HCC)    Diabetes mellitus without complication (HCC)    GERD (gastroesophageal reflux disease)    Hypertension     Past Surgical History:  Procedure Laterality Date   ABDOMINAL HYSTERECTOMY     CARPAL TUNNEL RELEASE     NECK SURGERY  2000   SPINE SURGERY      Family History  Problem Relation Age of Onset   Cancer Mother    Cancer Father     Social History   Tobacco Use   Smoking status: Former   Smokeless tobacco: Never    Prior to Admission medications   Medication Sig Start Date End Date Taking? Authorizing Provider  amitriptyline (ELAVIL) 25 MG tablet Take 25 mg by mouth at bedtime.   Yes [provider]  furosemide (LASIX) 20 MG tablet Take 10 mg by mouth.   Yes [provider]  gabapentin (NEURONTIN) 300 MG capsule Take 300 mg by mouth 2 (two) times daily.   Yes [provider]  glipiZIDE (GLUCOTROL) 10 MG tablet Take 10 mg by mouth daily before breakfast.   Yes [provider]   HYDROcodone-acetaminophen (NORCO/VICODIN) 5-325 MG tablet Limit one half to one tab by mouth per day or 2-3 times per day if tolerated 01/23/16  Yes Ewing Schlein, MD  metFORMIN (GLUCOPHAGE) 1000 MG tablet Take 1,000 mg by mouth 2 (two) times daily with a meal.   Yes [provider]  naproxen (EC NAPROSYN) 500 MG EC tablet Take 1 tablet (500 mg total) by mouth 2 (two) times daily with a meal. 09/10/15  Yes Menshew, Charlesetta Ivory, PA-C  omeprazole (PRILOSEC) 40 MG capsule Take 40 mg by mouth daily.   Yes [provider]  rosuvastatin (CRESTOR) 10 MG tablet Take 10 mg by mouth daily.   Yes [provider]    Current Facility-Administered Medications  Medication Dose Route Frequency Provider Last Rate Last Admin   acetaminophen (TYLENOL) tablet 650 mg  650 mg Oral Q4H PRN Lurene Shadow, MD       ALPRAZolam Prudy Feeler) tablet 0.25 mg  0.25 mg Oral BID PRN Floydene Flock, MD   0.25 mg at 11/04/22 2015   atorvastatin (LIPITOR) tablet 80 mg  80 mg Oral Daily Eula Listen M, PA-C   80 mg at 11/05/22 1308   cyanocobalamin (VITAMIN B12) injection 1,000 mcg  1,000 mcg Subcutaneous  Daily Lurene Shadow, MD   1,000 mcg at 11/05/22 0913   heparin ADULT infusion 100 units/mL (25000 units/248mL)  1,100 Units/hr Intravenous Continuous Barrie Folk, RPH 11 mL/hr at 11/05/22 1343 1,100 Units/hr at 11/05/22 1343   HYDROcodone-acetaminophen (NORCO) 7.5-325 MG per tablet 1 tablet  1 tablet Oral Q4H PRN Lurene Shadow, MD   1 tablet at 11/05/22 1610   insulin aspart (novoLOG) injection 0-6 Units  0-6 Units Subcutaneous Q4H Floydene Flock, MD   2 Units at 11/05/22 1133   isosorbide mononitrate (IMDUR) 24 hr tablet 30 mg  30 mg Oral Daily Eula Listen M, PA-C   30 mg at 11/05/22 9604   metoprolol tartrate (LOPRESSOR) tablet 25 mg  25 mg Oral BID Floydene Flock, MD   25 mg at 11/05/22 0912   morphine (PF) 2 MG/ML injection 2 mg  2 mg Intravenous Q2H PRN Lurene Shadow, MD        ondansetron [facility]) injection 4 mg  4 mg Intravenous Q6H PRN Floydene Flock, MD        Allergies as of 11/03/2022 - Review Complete 11/03/2022  Allergen Reaction Noted   Shellfish allergy Nausea And Vomiting 11/03/2022   Percocet [oxycodone-acetaminophen]  11/28/2017     Review of Systems:    All systems reviewed and negative except where noted in HPI.  Review of Systems  Constitutional:  Negative for chills and fever.  Respiratory:  Negative for shortness of breath.   Cardiovascular:  Negative for chest pain.  Gastrointestinal:  Negative for abdominal pain, blood in stool, constipation, diarrhea, nausea and vomiting.  Musculoskeletal:  Negative for joint pain.  Skin:  Negative for rash.  Neurological:  Negative for focal weakness.  Psychiatric/Behavioral:  Negative for substance abuse.   All other systems reviewed and are negative.      Physical Exam:  Vital signs in last 24 hours: Temp:  [98 F (36.7 C)-98.4 F (36.9 C)] 98.4 F (36.9 C) (06/24 1119) Pulse Rate:  [57-72] 62 (06/24 1119) Resp:  [16-20] 20 (06/24 1119) BP: (123-157)/(44-73) 141/55 (06/24 1119) SpO2:  [95 %-99 %] 97 % (06/24 1119) Last BM Date : 11/05/22 General:   Pleasant in NAD Head:  Normocephalic and atraumatic. Eyes:   No icterus.   Conjunctiva pink. Mouth: Mucosa pink moist, no lesions. Neck:  Supple; no masses felt Lungs:  No respiratory distress Abdomen:   Flat, soft, nondistended, nontender. No appreciable masses or hepatomegaly. No rebound signs or other peritoneal signs. Msk:  No clubbing or cyanosis. Symmetrical without gross deformities. Neurologic:  Alert and  oriented x4;  No focal deficits Skin:  Warm, dry, pink without significant lesions or rashes. Psych:  Alert and cooperative. Normal affect.  LAB RESULTS: Recent Labs    11/03/22 1504 11/04/22 0551 11/05/22 0427  WBC 6.8 7.3 8.4  HGB 8.4* 7.7* 8.3*  HCT 28.3* 26.3* 27.9*  PLT 371 323 351   BMET Recent Labs     11/03/22 1504 11/04/22 0112 11/05/22 0427  NA 140 138 140  K 5.2* 4.5 4.3  CL 104 105 108  CO2 26 24 25   GLUCOSE 87 211* 162*  BUN 21 22 18   CREATININE 1.15* 1.09* 0.97  CALCIUM 8.7* 8.4* 9.1   LFT Recent Labs    11/03/22 1504  PROT 7.2  ALBUMIN 3.9  AST 21  ALT 10  ALKPHOS 56  BILITOT 0.9   PT/INR Recent Labs    11/03/22 1720  LABPROT 14.4  INR 1.1  STUDIES: ECHOCARDIOGRAM COMPLETE  Result Date: 11/04/2022    ECHOCARDIOGRAM REPORT   Patient Name:   ZSOFIA PROUT Date of Exam: 11/04/2022 Medical Rec #:  [id number]     Height:       65.0 in Accession #:    [id number]    Weight:       185.0 lb Date of Birth:  Dec 29, 1950      BSA:          1.914 m Patient Age:    72 years      BP:           127/47 mmHg Patient Gender: F             HR:           73 bpm. Exam Location:  [facility] Procedure: 2D Echo Indications:     NSTEMI I21.4  History:         Patient has no prior history of Echocardiogram examinations.  Sonographer:     Overton Mam RDCS, FASE Referring Phys:  [id number] Francoise Schaumann [place] Diagnosing Phys: Armanda Magic MD  Sonographer Comments: Suboptimal parasternal window. Image acquisition challenging due to patient body habitus. IMPRESSIONS  1. Left ventricular ejection fraction, by estimation, is 55 to 60%. The left ventricle has normal function. The left ventricle has no regional wall motion abnormalities. Left ventricular diastolic parameters were normal.  2. Right ventricular systolic function is normal. The right ventricular size is normal. Tricuspid regurgitation signal is inadequate for assessing PA pressure.  3. The interatrial septum is very thickened symmetrically in the mid portion. 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 Could consider TEE or cardiac MRI for further evaluation.  4. The mitral valve is normal in structure. Trivial mitral valve regurgitation. No evidence of mitral stenosis.  5. The aortic valve is tricuspid. Aortic valve  regurgitation is not visualized. Aortic valve sclerosis/calcification is present, without any evidence of aortic stenosis.  6. The inferior vena cava is normal in size with greater than 50% respiratory variability, suggesting right atrial pressure of 3 mmHg. FINDINGS  Left Ventricle: Left ventricular ejection fraction, by estimation, is 55 to 60%. The left ventricle has normal function. The left ventricle has no regional wall motion abnormalities. The left ventricular internal cavity size was normal in size. There is  no left ventricular hypertrophy. Left ventricular diastolic parameters were normal. Normal left ventricular filling pressure. Right Ventricle: The right ventricular size is normal. No increase in right ventricular wall thickness. Right ventricular systolic function is normal. Tricuspid regurgitation signal is inadequate for assessing PA pressure. Left Atrium: Left atrial size was normal in size. Right Atrium: Right atrial size was normal in size. Pericardium: There is no evidence of pericardial effusion. Mitral Valve: The mitral valve is normal in structure. Trivial mitral valve regurgitation. No evidence of mitral valve stenosis. Tricuspid Valve: The tricuspid valve is normal in structure. Tricuspid valve regurgitation is not demonstrated. No evidence of tricuspid stenosis. Aortic Valve: The aortic valve is tricuspid. Aortic valve regurgitation is not visualized. Aortic valve sclerosis/calcification is present, without any evidence of aortic stenosis. Aortic valve peak gradient measures 11.4 mmHg. Pulmonic Valve: The pulmonic valve was normal in structure. Pulmonic valve regurgitation is not visualized. No evidence of pulmonic stenosis. Aorta: The aortic root is normal in size and structure. Venous: The inferior vena cava is normal in size with greater than 50% respiratory variability, suggesting right atrial pressure of 3 mmHg. IAS/Shunts: No atrial level shunt detected by  color flow Doppler.  LEFT  VENTRICLE PLAX 2D LVIDd:         4.60 cm     Diastology LVIDs:         3.20 cm     LV e' medial:    10.40 cm/s LV PW:         1.00 cm     LV E/e' medial:  10.8 LV IVS:        0.90 cm     LV e' lateral:   8.16 cm/s LVOT diam:     2.00 cm     LV E/e' lateral: 13.7 LVOT Area:     3.14 cm  LV Volumes (MOD) LV vol d, MOD A4C: 62.1 ml LV vol s, MOD A4C: 26.7 ml LV SV MOD A4C:     62.1 ml RIGHT VENTRICLE RV Basal diam:  2.60 cm RV S prime:     14.70 cm/s TAPSE (M-mode): 2.0 cm LEFT ATRIUM             Index        RIGHT ATRIUM          Index LA diam:        3.90 cm 2.04 cm/m   RA Area:     7.99 cm LA Vol (A2C):   74.4 ml 38.88 ml/m  RA Volume:   14.10 ml 7.37 ml/m LA Vol (A4C):   49.6 ml 25.92 ml/m LA Biplane Vol: 61.8 ml 32.29 ml/m  AORTIC VALVE AV Vmax:      169.00 cm/s AV Peak Grad: 11.4 mmHg  AORTA Ao Root diam: 2.50 cm Ao Asc diam:  2.40 cm MITRAL VALVE MV Area (PHT): 3.60 cm     SHUNTS MV Decel Time: 211 msec     Systemic Diam: 2.00 cm MV E velocity: 112.00 cm/s MV A velocity: 111.00 cm/s MV E/A ratio:  1.01 Armanda Magic MD Electronically signed by Armanda Magic MD Signature Date/Time: 11/04/2022/9:56:04 PM    Final (Updated)    DG Chest Port 1 View  Result Date: 11/03/2022 CLINICAL DATA:  Chest pain EXAM: PORTABLE CHEST 1 VIEW COMPARISON:  11/28/2017 FINDINGS: Mild cardiomegaly. Aortic atherosclerosis. Diffusely prominent interstitial markings. No pleural effusion or pneumothorax. IMPRESSION: Mild cardiomegaly with diffusely prominent interstitial markings, which may reflect mild interstitial edema. Electronically Signed   By: Duanne Guess D.O.   On: 11/03/2022 14:57       Impression / Plan:   72 y.o. lady with history of COPD, GERD, and hypertension who presented to the hospital with chest pain and troponin elevation, diagnosed with NSTEMI. Also found to have an IDA and B12 deficiency. Consulted for evaluation of IDA. No overt GI bleeding on heparin drip.  - difficult situation, have reached  out to cardiology about steps of evaluation. Recent NSTEMI is a relative contraindication to an endoscopic procedure. Given unremarkable colonoscopy in 2018 and daily PPI use without NSAID use, risk of lesion that would need intervention is low. A mass could be present but even then, a cath would be required prior to any surgical intervention - if cardiology requests EGD/Colonoscopy, then could add on for tomorrow, would need risk stratification documented - continue to monitor for any overt GI bleeding - daily cbc's - transfuse to keep hemoglobin > 8 - agree with IV iron and b12 supplementation - will place on clear liquids for now  Please call with any questions or concerns.  Merlyn Lot MD, MPH [facility]

## 2022-11-06 ENCOUNTER — Encounter
Admission: EM | Disposition: A | Discharge status: Home / Self Care | Payer: Self-pay | Source: Home / Self Care | Attending: Internal Medicine

## 2022-11-06 ENCOUNTER — Encounter: Payer: Self-pay | Admitting: Internal Medicine

## 2022-11-06 DIAGNOSIS — I5031 Acute diastolic (congestive) heart failure: Secondary | ICD-10-CM | POA: Diagnosis not present

## 2022-11-06 DIAGNOSIS — I214 Non-ST elevation (NSTEMI) myocardial infarction: Secondary | ICD-10-CM | POA: Diagnosis not present

## 2022-11-06 DIAGNOSIS — D509 Iron deficiency anemia, unspecified: Secondary | ICD-10-CM

## 2022-11-06 HISTORY — PX: LEFT HEART CATH AND CORONARY ANGIOGRAPHY: CATH118249

## 2022-11-06 LAB — CBC
HCT: 27.5 % — ABNORMAL LOW (ref 36.0–46.0)
Hemoglobin: 8.2 g/dL — ABNORMAL LOW (ref 12.0–15.0)
MCH: 24.6 pg — ABNORMAL LOW (ref 26.0–34.0)
MCHC: 29.8 g/dL — ABNORMAL LOW (ref 30.0–36.0)
MCV: 82.6 fL (ref 80.0–100.0)
Platelets: 336 10*3/uL (ref 150–400)
RBC: 3.33 MIL/uL — ABNORMAL LOW (ref 3.87–5.11)
RDW: 15 % (ref 11.5–15.5)
WBC: 8 10*3/uL (ref 4.0–10.5)
nRBC: 0 % (ref 0.0–0.2)

## 2022-11-06 LAB — GLUCOSE, CAPILLARY
Glucose-Capillary: 118 mg/dL — ABNORMAL HIGH (ref 70–99)
Glucose-Capillary: 137 mg/dL — ABNORMAL HIGH (ref 70–99)
Glucose-Capillary: 141 mg/dL — ABNORMAL HIGH (ref 70–99)
Glucose-Capillary: 158 mg/dL — ABNORMAL HIGH (ref 70–99)
Glucose-Capillary: 213 mg/dL — ABNORMAL HIGH (ref 70–99)
Glucose-Capillary: 237 mg/dL — ABNORMAL HIGH (ref 70–99)

## 2022-11-06 LAB — HEPARIN LEVEL (UNFRACTIONATED): Heparin Unfractionated: 0.28 IU/mL — ABNORMAL LOW (ref 0.30–0.70)

## 2022-11-06 SURGERY — LEFT HEART CATH AND CORONARY ANGIOGRAPHY
Anesthesia: Moderate Sedation

## 2022-11-06 MED ORDER — SODIUM CHLORIDE 0.9% FLUSH: 3.0000 mL | INTRAVENOUS | Status: DC | PRN

## 2022-11-06 MED ORDER — SODIUM CHLORIDE 0.9% FLUSH
3.0000 mL | Freq: Two times a day (BID) | INTRAVENOUS | Status: DC
  Administered 2022-11-06 – 2022-11-07 (×2): 3 mL via INTRAVENOUS

## 2022-11-06 MED ORDER — LABETALOL HCL 5 MG/ML IV SOLN: 10.0000 mg | INTRAVENOUS | Status: AC | PRN

## 2022-11-06 MED ORDER — FENTANYL CITRATE (PF) 100 MCG/2ML IJ SOLN
INTRAMUSCULAR | Status: DC | PRN
  Administered 2022-11-06 (×2): 25 ug via INTRAVENOUS

## 2022-11-06 MED ORDER — HEPARIN (PORCINE) IN NACL 1000-0.9 UT/500ML-% IV SOLN
INTRAVENOUS | Status: AC
  Filled 2022-11-06: qty 1000

## 2022-11-06 MED ORDER — FUROSEMIDE 10 MG/ML IJ SOLN
20.0000 mg | Freq: Once | INTRAMUSCULAR | Status: AC
  Administered 2022-11-06: 20 mg via INTRAVENOUS

## 2022-11-06 MED ORDER — MIDAZOLAM HCL 2 MG/2ML IJ SOLN
INTRAMUSCULAR | Status: DC | PRN
  Administered 2022-11-06: 1 mg via INTRAVENOUS

## 2022-11-06 MED ORDER — FUROSEMIDE 10 MG/ML IJ SOLN
INTRAMUSCULAR | Status: AC
  Filled 2022-11-06: qty 2

## 2022-11-06 MED ORDER — FENTANYL CITRATE (PF) 100 MCG/2ML IJ SOLN
INTRAMUSCULAR | Status: AC
  Filled 2022-11-06: qty 2

## 2022-11-06 MED ORDER — MIDAZOLAM HCL 2 MG/2ML IJ SOLN
INTRAMUSCULAR | Status: AC
  Filled 2022-11-06: qty 2

## 2022-11-06 MED ORDER — VERAPAMIL HCL 2.5 MG/ML IV SOLN
INTRAVENOUS | Status: AC
  Filled 2022-11-06: qty 2

## 2022-11-06 MED ORDER — HEPARIN (PORCINE) IN NACL 1000-0.9 UT/500ML-% IV SOLN
INTRAVENOUS | Status: DC | PRN
  Administered 2022-11-06 (×2): 500 mL

## 2022-11-06 MED ORDER — VERAPAMIL HCL 2.5 MG/ML IV SOLN
INTRAVENOUS | Status: DC | PRN
  Administered 2022-11-06: 2.5 mg via INTRA_ARTERIAL

## 2022-11-06 MED ORDER — HEPARIN BOLUS VIA INFUSION
1000.0000 [IU] | Freq: Once | INTRAVENOUS | Status: AC
  Administered 2022-11-06: 1000 [IU] via INTRAVENOUS
  Filled 2022-11-06: qty 1000

## 2022-11-06 MED ORDER — HEPARIN SODIUM (PORCINE) 1000 UNIT/ML IJ SOLN
INTRAMUSCULAR | Status: DC | PRN
  Administered 2022-11-06: 4000 [IU] via INTRAVENOUS

## 2022-11-06 MED ORDER — LIDOCAINE HCL (PF) 1 % IJ SOLN: INTRAMUSCULAR | Status: DC | PRN

## 2022-11-06 MED ORDER — HEPARIN SODIUM (PORCINE) 1000 UNIT/ML IJ SOLN
INTRAMUSCULAR | Status: AC
  Filled 2022-11-06: qty 10

## 2022-11-06 MED ORDER — HYDRALAZINE HCL 20 MG/ML IJ SOLN: 10.0000 mg | INTRAMUSCULAR | Status: AC | PRN

## 2022-11-06 MED ORDER — IOHEXOL 300 MG/ML  SOLN
INTRAMUSCULAR | Status: DC | PRN
  Administered 2022-11-06: 47 mL

## 2022-11-06 MED ORDER — SODIUM CHLORIDE 0.9 % IV SOLN: 250.0000 mL | INTRAVENOUS | Status: DC | PRN

## 2022-11-06 SURGICAL SUPPLY — 12 items
CATH 5F 110X4 TIG (CATHETERS) IMPLANT
CATH INFINITI 5FR ANG PIGTAIL (CATHETERS) IMPLANT
DEVICE RAD TR BAND REGULAR (VASCULAR PRODUCTS) IMPLANT
DRAPE BRACHIAL (DRAPES) IMPLANT
GLIDESHEATH SLEND SS 6F .021 (SHEATH) IMPLANT
GUIDEWIRE INQWIRE 1.5J.035X260 (WIRE) IMPLANT
INQWIRE 1.5J .035X260CM (WIRE) ×1
KIT SYRINGE INJ CVI SPIKEX1 (MISCELLANEOUS) IMPLANT
PACK CARDIAC CATH (CUSTOM PROCEDURE TRAY) ×1 IMPLANT
PROTECTION STATION PRESSURIZED (MISCELLANEOUS) ×1
SET ATX-X65L (MISCELLANEOUS) IMPLANT
STATION PROTECTION PRESSURIZED (MISCELLANEOUS) IMPLANT

## 2022-11-06 NOTE — Interval H&P Note (Signed)
History and Physical Interval Note:  11/06/2022 9:25 AM  Heidi Edwards  has presented today for surgery, with the diagnosis of Non-ST elevation myocardial infarction.  The various methods of treatment have been discussed with the patient and family. After consideration of risks, benefits and other options for treatment, the patient has consented to  Procedure(s): LEFT HEART CATH AND CORONARY ANGIOGRAPHY (N/A) as a surgical intervention.  The patient's history has been reviewed, patient examined, no change in status, stable for surgery.  I have reviewed the patient's chart and labs.  Questions were answered to the patient's satisfaction.    Cath Lab Visit (complete for each Cath Lab visit)  Clinical Evaluation Leading to the Procedure:   ACS: Yes.    Non-ACS:  N/A  Trenise Turay

## 2022-11-06 NOTE — Progress Notes (Signed)
Rounding Note    Patient Name: Heidi Edwards Date of Encounter: 11/06/2022  [facility] Cardiologist: Cathlyn Parsons  Subjective   No further chest pain or shortness of breath.  Inpatient Medications    Scheduled Meds:  [MAR Hold] aspirin  81 mg Oral Daily   [MAR Hold] atorvastatin  80 mg Oral Daily   [MAR Hold] cyanocobalamin  1,000 mcg Subcutaneous Daily   [MAR Hold] insulin aspart  0-6 Units Subcutaneous Q4H   [MAR Hold] isosorbide mononitrate  30 mg Oral Daily   [MAR Hold] metoprolol tartrate  25 mg Oral BID   sodium chloride flush  3 mL Intravenous Q12H   Continuous Infusions:  sodium chloride     sodium chloride 100 mL/hr at 11/06/22 0641   heparin Stopped (11/06/22 0846)   PRN Meds: sodium chloride, [MAR Hold] acetaminophen, [MAR Hold] ALPRAZolam, fentaNYL, Heparin (Porcine) in NaCl, heparin sodium (porcine), [MAR Hold] HYDROcodone-acetaminophen, iohexol, lidocaine (PF), midazolam, [MAR Hold]  morphine injection, [MAR Hold] ondansetron (ZOFRAN) IV, sodium chloride flush, verapamil   Vital Signs    Vitals:   11/06/22 0724 11/06/22 0930 11/06/22 0959 11/06/22 1042  BP: (!) 150/50 (!) 161/61  (!) 134/58  Pulse: 66 67  (!) 54  Resp: 18 18  14   Temp: 98 F (36.7 C) 98.3 F (36.8 C)    TempSrc:  Oral    SpO2: 96% 96% 98% 96%  Weight:      Height:        Intake/Output Summary (Last 24 hours) at 11/06/2022 1055 Last data filed at 11/06/2022 0641 Gross per 24 hour  Intake 900.19 ml  Output --  Net 900.19 ml      11/06/2022    4:09 AM 11/03/2022    2:53 PM 11/28/2017    9:11 PM  Last 3 Weights  Weight (lbs) 174 lb 13.2 oz 185 lb 196 lb  Weight (kg) 79.3 kg 83.915 kg 88.905 kg      Telemetry    Sinus rhythm with PVCs - Personally Reviewed  ECG    No new tracing  Physical Exam   GEN: No acute distress.   Neck: No JVD Cardiac: RRR, no murmurs, rubs, or gallops.  Respiratory: Mildly diminished breath sounds without wheezes or crackles. GI:  Soft, nontender, non-distended  MS: No edema; No deformity. Neuro:  Nonfocal  Psych: Normal affect   Labs    High Sensitivity Troponin:   Recent Labs  Lab 11/03/22 1504 11/03/22 1720 11/03/22 1943 11/04/22 1255  TROPONINIHS 132* 881* 1,950* 2,348*     Chemistry Recent Labs  Lab 11/03/22 1504 11/04/22 0112 11/05/22 0427  NA 140 138 140  K 5.2* 4.5 4.3  CL 104 105 108  CO2 26 24 25   GLUCOSE 87 211* 162*  BUN 21 22 18   CREATININE 1.15* 1.09* 0.97  CALCIUM 8.7* 8.4* 9.1  MG  --   --  2.1  PROT 7.2  --   --   ALBUMIN 3.9  --   --   AST 21  --   --   ALT 10  --   --   ALKPHOS 56  --   --   BILITOT 0.9  --   --   GFRNONAA 50* 54* >60  ANIONGAP 10 9 7     Lipids  Recent Labs  Lab 11/04/22 0551  CHOL 117  TRIG 90  HDL 35*  LDLCALC 64  CHOLHDL 3.3    Hematology Recent Labs  Lab 11/04/22  0981 11/05/22 0427 11/06/22 0545  WBC 7.3 8.4 8.0  RBC 3.18* 3.42* 3.33*  HGB 7.7* 8.3* 8.2*  HCT 26.3* 27.9* 27.5*  MCV 82.7 81.6 82.6  MCH 24.2* 24.3* 24.6*  MCHC 29.3* 29.7* 29.8*  RDW 14.8 14.9 15.0  PLT 323 351 336   Thyroid No results for input(s): "TSH", "FREET4" in the last 168 hours.  BNP Recent Labs  Lab 11/03/22 1504  BNP 52.5    DDimer No results for input(s): "DDIMER" in the last 168 hours.   Radiology    CARDIAC CATHETERIZATION  Result Date: 11/06/2022 Conclusions: Mild, non-obstructive coronary artery disease with 10-20% stenoses involving ostial LAD and ostial D2.  No angiographically significant disease noted in the LMCA, LCx, or RCA.  No culprit lesion identified for the patient's chest pain and elevated troponin (MINOCA).  Consider supply-demand mismatch in the setting of significant anemia. Normal left ventricular systolic function (LVEF 55-65%) with mildly elevated filling pressure (LVEDP 23 mmHg). Recommendations: Medical therapy and risk factor modification to prevent progression of coronary artery disease. Gentle diuresis. Proceed with GI  workup of severe anemia. Yvonne Kendall, MD [facility]   Cardiac Studies   See catheterization above  TTE (11/04/2022):  1. Left ventricular ejection fraction, by estimation, is 55 to 60%. The  left ventricle has normal function. The left ventricle has no regional  wall motion abnormalities. Left ventricular diastolic parameters were  normal.   2. Right ventricular systolic function is normal. The right ventricular  size is normal. Tricuspid regurgitation signal is inadequate for assessing  PA pressure.   3. The interatrial septum is very thickened symmetrically in the mid  portion. 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 Could consider TEE or cardiac MRI for further evaluation.   4. The mitral valve is normal in structure. Trivial mitral valve  regurgitation. No evidence of mitral stenosis.   5. The aortic valve is tricuspid. Aortic valve regurgitation is not  visualized. Aortic valve sclerosis/calcification is present, without any  evidence of aortic stenosis.   6. The inferior vena cava is normal in size with greater than 50%  respiratory variability, suggesting right atrial pressure of 3 mmHg.   Patient Profile     72 y.o. female with history of hypertension, hyperlipidemia, type 2 diabetes mellitus, COPD, and degenerative disc disease, admitted with chest pain and elevated troponin complicated by severe anemia.  Assessment & Plan    NSTEMI: Patient with typical symptoms prior to presentation with troponin peaking at over 2000.  However, catheterization today showed mild, nonobstructive coronary artery disease consistent with myocardial infarction with nonobstructive coronary artery disease (MINOCA).  Question if progressive anemia will lead to supply-demand mismatch. -Discontinue heparin and aspirin with mild CAD and severe anemia. -Continue atorvastatin to prevent progression of disease. -Continue isosorbide mononitrate and metoprolol  tartrate in case there is an element of microvascular dysfunction and/or coronary vasospasm. -Maintain hemoglobin greater than 8.  Iron deficiency anemia: Chronicity unclear, though hemoglobin was 11.8 on last check in our system in 2019.  Hemoglobin stable thus far this admission. -Hold aspirin and heparin. -Consider empiric PPI. -Ongoing management per internal medicine and GI.  Acute HFpEF: LVEF normal.  LVEDP mildly elevated. -Administer furosemide 20 mg IV x 1. -Continue metoprolol and isosorbide mononitrate as above. -Could consider at an of SGLT2 inhibitor in the future if patient has ongoing symptoms of HFpEF.  Hypertension: Blood pressure upper normal to moderately elevated. -Monitor blood pressure post cath and following gentle  diuresis. -If needed, consider addition of ACE inhibitor/ARB in the setting of hypertension and diabetes mellitus.  Type 2 diabetes mellitus: -Sliding scale insulin per IM. -Consider addition of SGLT2 inhibitor; this could be pursued as an outpatient.  For questions or updates, please contact [facility] Please consult [email or web address] for contact info under [facility].     Signed, Yvonne Kendall, MD  11/06/2022, 10:55 AM

## 2022-11-06 NOTE — Progress Notes (Signed)
Progress Note    Heidi Edwards  WGN:[id number] DOB: [birth year]  DOA: 11/03/2022 PCP: Selinda Michaels, MD      Brief Narrative:    Medical records reviewed and are as summarized below:  Heidi Edwards is a 72 y.o. female with medical history significant for hypertension, type II DM, obesity, COPD, GERD, degenerative disc disease of the cervical spine, chronic pain syndrome, who presented to the hospital with chest pain.  She said she had been cooking all day at home and the room temperature was up to about 65 F so she decided to turn on the [facility]. Troponins went from 130-2,348.  She was admitted to the hospital for acute NSTEMI.  She was treated with IV heparin drip.      Assessment/Plan:   Principal Problem:   NSTEMI (non-ST elevated myocardial infarction) (HCC) Active Problems:   Vitamin B12 deficiency anemia   BP (high blood pressure)   HLD (hyperlipidemia)   Type 2 diabetes mellitus (HCC)   Iron deficiency anemia    Body mass index is 29.09 kg/m.   Acute NSTEMI: Plan for diagnostic left heart cath today.  Continue metoprolol and Lipitor.  Continue metoprolol and Lipitor.  2D echo showed EF estimated at 55 to 60%, normal LV diastolic parameters. ? lipomatous hypertrophy interatrial septum.  Follow-up with cardiologist.   Type II DM: Metformin on hold.  Use NovoLog as needed for hyperglycemia.   Hypertension: continue metoprolol.   Hyperlipidemia: Continue Lipitor   Normocytic anemia, iron deficiency anemia: H&H is stable.  Hemoglobin is up to 8.3.    Iron 41, TIBC 454, iron saturation 9 and ferritin 5.  Vitamin B12 177. S/p infusion with IV iron sucrose on 11/04/2022 and 11/05/2022. Continue daily vitamin B12 injections. She had a colonoscopy in 2018 which revealed colonic polyps (adenomatous polyp on pathology) and nonbleeding internal hemorrhoids. Plan for inpatient colonoscopy noted.  Follow-up with gastroenterologist    Diet Order             Diet  NPO time specified Except for: Sips with Meds  Diet effective midnight                            Consultants: Cardiologist Gastroenterologist  Procedures: None    Medications:    aspirin  81 mg Oral Daily   atorvastatin  80 mg Oral Daily   cyanocobalamin  1,000 mcg Subcutaneous Daily   insulin aspart  0-6 Units Subcutaneous Q4H   isosorbide mononitrate  30 mg Oral Daily   metoprolol tartrate  25 mg Oral BID   sodium chloride flush  3 mL Intravenous Q12H   Continuous Infusions:  sodium chloride     sodium chloride 100 mL/hr at 11/06/22 0641   heparin Stopped (11/06/22 0846)     Anti-infectives (From admission, onward)    None              Family Communication/Anticipated D/C date and plan/Code Status   DVT prophylaxis: SCDs Start: 11/03/22 1741     Code Status: Full Code  Family Communication: None Disposition Plan: Plan to discharge home in 2 to 3 days   Status is: Inpatient Remains inpatient appropriate because: Acute NSTEMI       Subjective:   Interval events noted.  She says she is nervous about the upcoming procedure.  No chest pain or shortness of breath. Ardie, her friend, was at the bedside.  She wanted  Ardie to be present for this encounter  Objective:    Vitals:   11/06/22 0005 11/06/22 0311 11/06/22 0409 11/06/22 0724  BP: (!) 136/50 (!) 150/55  (!) 150/50  Pulse: (!) 56 66  66  Resp: 17 19  18   Temp: 97.6 F (36.4 C) 98.4 F (36.9 C)  98 F (36.7 C)  TempSrc: Oral Oral    SpO2: 98% 98%  96%  Weight:   79.3 kg   Height:       No data found.   Intake/Output Summary (Last 24 hours) at 11/06/2022 0850 Last data filed at 11/06/2022 0641 Gross per 24 hour  Intake 1581.47 ml  Output --  Net 1581.47 ml   Filed Weights   11/03/22 1453 11/06/22 0409  Weight: 83.9 kg 79.3 kg    Exam:  GEN: NAD SKIN: Warm and dry EYES: No pallor or icterus ENT: MMM CV: RRR PULM: CTA B ABD: soft, ND, NT, +BS CNS:  AAO x 3, non focal EXT: No edema or tenderness        Data Reviewed:   I have personally reviewed following labs and imaging studies:  Labs: Labs show the following:   Basic Metabolic Panel: Recent Labs  Lab 11/03/22 1504 11/04/22 0112 11/05/22 0427  NA 140 138 140  K 5.2* 4.5 4.3  CL 104 105 108  CO2 26 24 25   GLUCOSE 87 211* 162*  BUN 21 22 18   CREATININE 1.15* 1.09* 0.97  CALCIUM 8.7* 8.4* 9.1  MG  --   --  2.1   GFR Estimated Creatinine Clearance: 53.7 mL/min (by C-G formula based on SCr of 0.97 mg/dL). Liver Function Tests: Recent Labs  Lab 11/03/22 1504  AST 21  ALT 10  ALKPHOS 56  BILITOT 0.9  PROT 7.2  ALBUMIN 3.9   No results for input(s): "LIPASE", "AMYLASE" in the last 168 hours. No results for input(s): "AMMONIA" in the last 168 hours. Coagulation profile Recent Labs  Lab 11/03/22 1720  INR 1.1    CBC: Recent Labs  Lab 11/03/22 1504 11/04/22 0551 11/05/22 0427 11/06/22 0545  WBC 6.8 7.3 8.4 8.0  NEUTROABS 4.6  --   --   --   HGB 8.4* 7.7* 8.3* 8.2*  HCT 28.3* 26.3* 27.9* 27.5*  MCV 83.0 82.7 81.6 82.6  PLT 371 323 351 336   Cardiac Enzymes: No results for input(s): "CKTOTAL", "CKMB", "CKMBINDEX", "TROPONINI" in the last 168 hours. BNP (last 3 results) No results for input(s): "PROBNP" in the last 8760 hours. CBG: Recent Labs  Lab 11/05/22 1613 11/05/22 1939 11/06/22 0001 11/06/22 0312 11/06/22 0727  GLUCAP 167* 251* 118* 141* 137*   D-Dimer: No results for input(s): "DDIMER" in the last 72 hours. Hgb A1c: Recent Labs    11/03/22 1854  HGBA1C 8.1*   Lipid Profile: Recent Labs    11/04/22 0551  CHOL 117  HDL 35*  LDLCALC 64  TRIG 90  CHOLHDL 3.3   Thyroid function studies: No results for input(s): "TSH", "T4TOTAL", "T3FREE", "THYROIDAB" in the last 72 hours.  Invalid input(s): "FREET3" Anemia work up: Recent Labs    11/04/22 0951  VITAMINB12 177*  FERRITIN 5*  TIBC 454*  IRON 41   Sepsis  Labs: Recent Labs  Lab 11/03/22 1504 11/04/22 0551 11/05/22 0427 11/06/22 0545  WBC 6.8 7.3 8.4 8.0    Microbiology No results found for this or any previous visit (from the past 240 hour(s)).  Procedures and diagnostic studies:  ECHOCARDIOGRAM COMPLETE  Result Date: 11/04/2022    ECHOCARDIOGRAM REPORT   Patient Name:   Heidi Edwards Date of Exam: 11/04/2022 Medical Rec #:  [id number]     Height:       65.0 in Accession #:    [id number]    Weight:       185.0 lb Date of Birth:  1951/05/03      BSA:          1.914 m Patient Age:    72 years      BP:           127/47 mmHg Patient Gender: F             HR:           73 bpm. Exam Location:  [facility] Procedure: 2D Echo Indications:     NSTEMI I21.4  History:         Patient has no prior history of Echocardiogram examinations.  Sonographer:     Overton Mam RDCS, FASE Referring Phys:  [id number] Francoise Schaumann [place] Diagnosing Phys: Armanda Magic MD  Sonographer Comments: Suboptimal parasternal window. Image acquisition challenging due to patient body habitus. IMPRESSIONS  1. Left ventricular ejection fraction, by estimation, is 55 to 60%. The left ventricle has normal function. The left ventricle has no regional wall motion abnormalities. Left ventricular diastolic parameters were normal.  2. Right ventricular systolic function is normal. The right ventricular size is normal. Tricuspid regurgitation signal is inadequate for assessing PA pressure.  3. The interatrial septum is very thickened symmetrically in the mid portion. 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 Could consider TEE or cardiac MRI for further evaluation.  4. The mitral valve is normal in structure. Trivial mitral valve regurgitation. No evidence of mitral stenosis.  5. The aortic valve is tricuspid. Aortic valve regurgitation is not visualized. Aortic valve sclerosis/calcification is present, without any evidence of aortic stenosis.  6. The inferior vena cava is normal in  size with greater than 50% respiratory variability, suggesting right atrial pressure of 3 mmHg. FINDINGS  Left Ventricle: Left ventricular ejection fraction, by estimation, is 55 to 60%. The left ventricle has normal function. The left ventricle has no regional wall motion abnormalities. The left ventricular internal cavity size was normal in size. There is  no left ventricular hypertrophy. Left ventricular diastolic parameters were normal. Normal left ventricular filling pressure. Right Ventricle: The right ventricular size is normal. No increase in right ventricular wall thickness. Right ventricular systolic function is normal. Tricuspid regurgitation signal is inadequate for assessing PA pressure. Left Atrium: Left atrial size was normal in size. Right Atrium: Right atrial size was normal in size. Pericardium: There is no evidence of pericardial effusion. Mitral Valve: The mitral valve is normal in structure. Trivial mitral valve regurgitation. No evidence of mitral valve stenosis. Tricuspid Valve: The tricuspid valve is normal in structure. Tricuspid valve regurgitation is not demonstrated. No evidence of tricuspid stenosis. Aortic Valve: The aortic valve is tricuspid. Aortic valve regurgitation is not visualized. Aortic valve sclerosis/calcification is present, without any evidence of aortic stenosis. Aortic valve peak gradient measures 11.4 mmHg. Pulmonic Valve: The pulmonic valve was normal in structure. Pulmonic valve regurgitation is not visualized. No evidence of pulmonic stenosis. Aorta: The aortic root is normal in size and structure. Venous: The inferior vena cava is normal in size with greater than 50% respiratory variability, suggesting right atrial pressure of 3 mmHg. IAS/Shunts: No atrial level shunt detected by color flow Doppler.  LEFT VENTRICLE PLAX 2D LVIDd:         4.60 cm     Diastology LVIDs:         3.20 cm     LV e' medial:    10.40 cm/s LV PW:         1.00 cm     LV E/e' medial:  10.8 LV  IVS:        0.90 cm     LV e' lateral:   8.16 cm/s LVOT diam:     2.00 cm     LV E/e' lateral: 13.7 LVOT Area:     3.14 cm  LV Volumes (MOD) LV vol d, MOD A4C: 62.1 ml LV vol s, MOD A4C: 26.7 ml LV SV MOD A4C:     62.1 ml RIGHT VENTRICLE RV Basal diam:  2.60 cm RV S prime:     14.70 cm/s TAPSE (M-mode): 2.0 cm LEFT ATRIUM             Index        RIGHT ATRIUM          Index LA diam:        3.90 cm 2.04 cm/m   RA Area:     7.99 cm LA Vol (A2C):   74.4 ml 38.88 ml/m  RA Volume:   14.10 ml 7.37 ml/m LA Vol (A4C):   49.6 ml 25.92 ml/m LA Biplane Vol: 61.8 ml 32.29 ml/m  AORTIC VALVE AV Vmax:      169.00 cm/s AV Peak Grad: 11.4 mmHg  AORTA Ao Root diam: 2.50 cm Ao Asc diam:  2.40 cm MITRAL VALVE MV Area (PHT): 3.60 cm     SHUNTS MV Decel Time: 211 msec     Systemic Diam: 2.00 cm MV E velocity: 112.00 cm/s MV A velocity: 111.00 cm/s MV E/A ratio:  1.01 Armanda Magic MD Electronically signed by Armanda Magic MD Signature Date/Time: 11/04/2022/9:56:04 PM    Final (Updated)                LOS: 3 days   Dietrich Ke  [facility]   Pager on [email or web address]. If 7PM-7AM, please contact night-coverage at [email or web address]     11/06/2022, 8:50 AM

## 2022-11-06 NOTE — Plan of Care (Signed)
Problem: Education: Goal: Understanding of cardiac disease, CV risk reduction, and recovery process will improve Outcome: Progressing Goal: Individualized Educational Video(s) Outcome: Progressing   Problem: Activity: Goal: Ability to tolerate increased activity will improve Outcome: Progressing   Problem: Cardiac: Goal: Ability to achieve and maintain adequate cardiovascular perfusion will improve Outcome: Progressing   Problem: Health Behavior/Discharge Planning: Goal: Ability to safely manage health-related needs after discharge will improve Outcome: Progressing   Problem: Education: Goal: Ability to describe self-care measures that may prevent or decrease complications (Diabetes Survival Skills Education) will improve Outcome: Progressing Goal: Individualized Educational Video(s) Outcome: Progressing   Problem: Coping: Goal: Ability to adjust to condition or change in health will improve Outcome: Progressing   Problem: Fluid Volume: Goal: Ability to maintain a balanced intake and output will improve Outcome: Progressing   Problem: Health Behavior/Discharge Planning: Goal: Ability to identify and utilize available resources and services will improve Outcome: Progressing Goal: Ability to manage health-related needs will improve Outcome: Progressing   Problem: Metabolic: Goal: Ability to maintain appropriate glucose levels will improve Outcome: Progressing   Problem: Nutritional: Goal: Maintenance of adequate nutrition will improve Outcome: Progressing Goal: Progress toward achieving an optimal weight will improve Outcome: Progressing   Problem: Skin Integrity: Goal: Risk for impaired skin integrity will decrease Outcome: Progressing   Problem: Tissue Perfusion: Goal: Adequacy of tissue perfusion will improve Outcome: Progressing   Problem: Education: Goal: Knowledge of General Education information will improve Description: Including pain rating scale,  medication(s)/side effects and non-pharmacologic comfort measures Outcome: Progressing   Problem: Health Behavior/Discharge Planning: Goal: Ability to manage health-related needs will improve Outcome: Progressing   Problem: Clinical Measurements: Goal: Ability to maintain clinical measurements within normal limits will improve Outcome: Progressing Goal: Will remain free from infection Outcome: Progressing Goal: Diagnostic test results will improve Outcome: Progressing Goal: Respiratory complications will improve Outcome: Progressing Goal: Cardiovascular complication will be avoided Outcome: Progressing   Problem: Activity: Goal: Risk for activity intolerance will decrease Outcome: Progressing   Problem: Nutrition: Goal: Adequate nutrition will be maintained Outcome: Progressing   Problem: Coping: Goal: Level of anxiety will decrease Outcome: Progressing   Problem: Elimination: Goal: Will not experience complications related to bowel motility Outcome: Progressing Goal: Will not experience complications related to urinary retention Outcome: Progressing   Problem: Pain Managment: Goal: General experience of comfort will improve Outcome: Progressing   Problem: Safety: Goal: Ability to remain free from injury will improve Outcome: Progressing   Problem: Skin Integrity: Goal: Risk for impaired skin integrity will decrease Outcome: Progressing   Problem: Education: Goal: Understanding of CV disease, CV risk reduction, and recovery process will improve Outcome: Progressing Goal: Individualized Educational Video(s) Outcome: Progressing   Problem: Activity: Goal: Ability to return to baseline activity level will improve Outcome: Progressing   Problem: Cardiovascular: Goal: Ability to achieve and maintain adequate cardiovascular perfusion will improve Outcome: Progressing Goal: Vascular access site(s) Level 0-1 will be maintained Outcome: Progressing   Problem:  Health Behavior/Discharge Planning: Goal: Ability to safely manage health-related needs after discharge will improve Outcome: Progressing   Problem: Education: Goal: Understanding of CV disease, CV risk reduction, and recovery process will improve Outcome: Progressing Goal: Individualized Educational Video(s) Outcome: Progressing   Problem: Activity: Goal: Ability to return to baseline activity level will improve Outcome: Progressing   Problem: Cardiovascular: Goal: Ability to achieve and maintain adequate cardiovascular perfusion will improve Outcome: Progressing Goal: Vascular access site(s) Level 0-1 will be maintained Outcome: Progressing   Problem: Health Behavior/Discharge Planning: Goal:  Ability to safely manage health-related needs after discharge will improve Outcome: Progressing   Problem: Education: Goal: Understanding of CV disease, CV risk reduction, and recovery process will improve Outcome: Progressing Goal: Individualized Educational Video(s) Outcome: Progressing

## 2022-11-06 NOTE — Care Management Important Message (Signed)
Important Message  Patient Details  Name: Heidi Edwards MRN: [id number] Date of Birth: 1950-10-25   Medicare Important Message Given:  Yes  Patient out of room upon time of visit, no family in room.  Copy of Medicare IM left in room for reference.   Johnell Comings 11/06/2022, 10:37 AM

## 2022-11-06 NOTE — Progress Notes (Signed)
GI Inpatient Follow-up Note  Subjective:  Patient seen and doing well. No obstructive lesions seen on cath today.  Scheduled Inpatient Medications:   aspirin  81 mg Oral Daily   atorvastatin  80 mg Oral Daily   cyanocobalamin  1,000 mcg Subcutaneous Daily   insulin aspart  0-6 Units Subcutaneous Q4H   isosorbide mononitrate  30 mg Oral Daily   metoprolol tartrate  25 mg Oral BID   sodium chloride flush  3 mL Intravenous Q12H    Continuous Inpatient Infusions:    sodium chloride      PRN Inpatient Medications:  sodium chloride, acetaminophen, ALPRAZolam, hydrALAZINE, HYDROcodone-acetaminophen, labetalol, morphine injection, ondansetron (ZOFRAN) IV, sodium chloride flush  Review of Systems:  Review of Systems  Constitutional:  Negative for chills and fever.  Respiratory:  Negative for shortness of breath.   Cardiovascular:  Negative for chest pain.  Gastrointestinal:  Negative for abdominal pain, blood in stool, constipation, diarrhea, melena, nausea and vomiting.  Musculoskeletal:  Negative for joint pain.  Skin:  Negative for rash.  Neurological:  Negative for focal weakness.  Psychiatric/Behavioral:  Negative for substance abuse.   All other systems reviewed and are negative.     Physical Examination: BP (!) 121/43 (BP Location: Left Arm)   Pulse 70   Temp 98.3 F (36.8 C) (Oral)   Resp 17   Ht 5\' 5"  (1.651 m)   Wt 79.3 kg   SpO2 97%   BMI 29.09 kg/m  Gen: NAD, alert and oriented x 4 HEENT: PEERLA, EOMI, Neck: supple, no JVD or thyromegaly Chest: No respiratory distress Abd: soft, non-tender, non-distended, no HSM, guarding, ridigity, or rebound tenderness Ext: no edema, well perfused with 2+ pulses, Skin: no rash or lesions noted Lymph: no LAD  Data: Lab Results  Component Value Date   WBC 8.0 11/06/2022   HGB 8.2 (L) 11/06/2022   HCT 27.5 (L) 11/06/2022   MCV 82.6 11/06/2022   PLT 336 11/06/2022   Recent Labs  Lab 11/04/22 0551 11/05/22 0427  11/06/22 0545  HGB 7.7* 8.3* 8.2*   Lab Results  Component Value Date   NA 140 11/05/2022   K 4.3 11/05/2022   CL 108 11/05/2022   CO2 25 11/05/2022   BUN 18 11/05/2022   CREATININE 0.97 11/05/2022   GLU 193 07/06/2014   Lab Results  Component Value Date   ALT 10 11/03/2022   AST 21 11/03/2022   ALKPHOS 56 11/03/2022   BILITOT 0.9 11/03/2022   Recent Labs  Lab 11/03/22 1720  APTT 28  INR 1.1   Assessment/Plan: Ms. Stlouis is a 72 y.o. lady with history of COPD, GERD, and hypertension who presented to the hospital with chest pain and troponin elevation, diagnosed with NSTEMI. No obstructive lesions on cardiac cath. Also found to have an IDA and B12 deficiency. Consulted for evaluation of IDA.  Recommendations:  - continue to monitor for any overt GI bleeding - PO iron and B12 - will f/u as an outpatient to schedule EGD/Colonoscopy - ok for diet - will follow peripherally, please call with any questions or concerns  Merlyn Lot MD, MPH [facility]

## 2022-11-06 NOTE — Consult Note (Signed)
ANTICOAGULATION CONSULT NOTE   Pharmacy Consult for Heparin Infusion  Indication: chest pain/ACS  Allergies  Allergen Reactions   Shellfish Allergy Nausea And Vomiting   Percocet [Oxycodone-Acetaminophen]     Patient Measurements: Height: 5\' 5"  (165.1 cm) Weight: 79.3 kg (174 lb 13.2 oz) IBW/kg (Calculated) : 57 Heparin Dosing Weight: 75 kg  Vital Signs: Temp: 98.4 F (36.9 C) (06/25 0311) Temp Source: Oral (06/25 0311) BP: 150/55 (06/25 0311) Pulse Rate: 66 (06/25 0311)  Labs: Recent Labs    11/03/22 1504 11/03/22 1720 11/03/22 1943 11/04/22 0112 11/04/22 0551 11/04/22 0956 11/04/22 1255 11/04/22 1745 11/05/22 0427 11/06/22 0545  HGB 8.4*  --   --   --  7.7*  --   --   --  8.3* 8.2*  HCT 28.3*  --   --   --  26.3*  --   --   --  27.9* 27.5*  PLT 371  --   --   --  323  --   --   --  351 336  APTT  --  28  --   --   --   --   --   --   --   --   LABPROT  --  14.4  --   --   --   --   --   --   --   --   INR  --  1.1  --   --   --   --   --   --   --   --   HEPARINUNFRC  --   --   --  0.16*  --    < >  --  0.31 0.37 0.28*  CREATININE 1.15*  --   --  1.09*  --   --   --   --  0.97  --   TROPONINIHS 132* 881* 1,950*  --   --   --  2,348*  --   --   --    < > = values in this interval not displayed.     Estimated Creatinine Clearance: 53.7 mL/min (by C-G formula based on SCr of 0.97 mg/dL).   Medical History: Past Medical History:  Diagnosis Date   COPD (chronic obstructive pulmonary disease) (HCC)    Diabetes mellitus without complication (HCC)    GERD (gastroesophageal reflux disease)    Hypertension     Medications:  Scheduled:  Infusions:  PRN:   Assessment: Heidi Edwards is a 72 y.o. female presenting with chest pain. PMH significant for T2DM, HTN, DDD. Patient was not on [facility] PTA per chart review. Trops elevated 1.9K. Pharmacy has been consulted to initiate and manage heparin infusion.   6/23 0112 HL 0.16, subtherapeutic @ 900 u/hr 6/23 0956 HL  0.36, therapeutic x 1 6/23 1745 HL 0.31, therapeutic x 2 6/24 0427 HL 0.37, therapeutic x 2 6/25 0545 HL 0.28, subtherapeutic @ 1100 u/hr  Goal of Therapy:  Heparin level 0.3-0.7 units/ml Monitor platelets by anticoagulation protocol: Yes    Plan:  HL is subtherapeutic Give heparin 1000 units IV x 1 Increase heparin infusion to 1250 units/hr Check HL 8 hours after rate change Daily CBC while on heparin  Barrie Folk, PharmD 11/06/2022 6:42 AM

## 2022-11-06 NOTE — TOC CM/SW Note (Signed)
Transition of Care [facility]) - Inpatient Brief Assessment   Patient Details  Name: Heidi Edwards MRN: [id number] Date of Birth: Nov 06, 1950  Transition of [facility]) CM/SW Contact:    Margarito Liner, LCSW Phone Number: 11/06/2022, 4:39 PM   Clinical Narrative: CSW reviewed chart. No TOC needs identified at this time. CSW will continue to follow progress. Please place [facility] consult if any needs arise.  Transition of Care Asessment: Insurance and Status: Insurance coverage has been reviewed Patient has primary care physician: Yes Home environment has been reviewed: Single family home Prior level of function:: Was able to cook for herself prior to admission. Prior/Current Home Services: No current home services Social Determinants of Health Reivew: SDOH reviewed no interventions necessary Readmission risk has been reviewed: Yes Transition of care needs: no transition of care needs at this time

## 2022-11-07 ENCOUNTER — Other Ambulatory Visit: Payer: Self-pay

## 2022-11-07 DIAGNOSIS — D509 Iron deficiency anemia, unspecified: Secondary | ICD-10-CM | POA: Diagnosis not present

## 2022-11-07 DIAGNOSIS — I2489 Other forms of acute ischemic heart disease: Secondary | ICD-10-CM

## 2022-11-07 DIAGNOSIS — I5031 Acute diastolic (congestive) heart failure: Secondary | ICD-10-CM | POA: Diagnosis not present

## 2022-11-07 DIAGNOSIS — D649 Anemia, unspecified: Secondary | ICD-10-CM | POA: Diagnosis not present

## 2022-11-07 DIAGNOSIS — E782 Mixed hyperlipidemia: Secondary | ICD-10-CM

## 2022-11-07 DIAGNOSIS — E1165 Type 2 diabetes mellitus with hyperglycemia: Secondary | ICD-10-CM

## 2022-11-07 DIAGNOSIS — R7989 Other specified abnormal findings of blood chemistry: Secondary | ICD-10-CM

## 2022-11-07 LAB — GLUCOSE, CAPILLARY
Glucose-Capillary: 156 mg/dL — ABNORMAL HIGH (ref 70–99)
Glucose-Capillary: 162 mg/dL — ABNORMAL HIGH (ref 70–99)
Glucose-Capillary: 206 mg/dL — ABNORMAL HIGH (ref 70–99)
Glucose-Capillary: 231 mg/dL — ABNORMAL HIGH (ref 70–99)

## 2022-11-07 LAB — BASIC METABOLIC PANEL
Anion gap: 9 (ref 5–15)
BUN: 19 mg/dL (ref 8–23)
CO2: 25 mmol/L (ref 22–32)
Calcium: 8.4 mg/dL — ABNORMAL LOW (ref 8.9–10.3)
Chloride: 102 mmol/L (ref 98–111)
Creatinine, Ser: 1.09 mg/dL — ABNORMAL HIGH (ref 0.44–1.00)
GFR, Estimated: 54 mL/min — ABNORMAL LOW (ref >60)
Glucose, Bld: 244 mg/dL — ABNORMAL HIGH (ref 70–99)
Potassium: 3.8 mmol/L (ref 3.5–5.1)
Sodium: 136 mmol/L (ref 135–145)

## 2022-11-07 LAB — CBC
HCT: 26.2 % — ABNORMAL LOW (ref 36.0–46.0)
Hemoglobin: 7.9 g/dL — ABNORMAL LOW (ref 12.0–15.0)
MCH: 24.7 pg — ABNORMAL LOW (ref 26.0–34.0)
MCHC: 30.2 g/dL (ref 30.0–36.0)
MCV: 81.9 fL (ref 80.0–100.0)
Platelets: 330 10*3/uL (ref 150–400)
RBC: 3.2 MIL/uL — ABNORMAL LOW (ref 3.87–5.11)
RDW: 15 % (ref 11.5–15.5)
WBC: 9.3 10*3/uL (ref 4.0–10.5)
nRBC: 0 % (ref 0.0–0.2)

## 2022-11-07 MED ORDER — LISINOPRIL 20 MG PO TABS
30.0000 mg | ORAL_TABLET | Freq: Every day | ORAL | Status: DC
  Administered 2022-11-07: 30 mg via ORAL
  Filled 2022-11-07: qty 1

## 2022-11-07 MED ORDER — FUROSEMIDE 20 MG PO TABS: 20.0000 mg | ORAL_TABLET | Freq: Every day | ORAL | 0 refills | Status: AC

## 2022-11-07 MED ORDER — FERROUS SULFATE 325 (65 FE) MG PO TBEC: 325.0000 mg | DELAYED_RELEASE_TABLET | Freq: Two times a day (BID) | ORAL | 3 refills | Status: AC

## 2022-11-07 MED ORDER — INSULIN ASPART 100 UNIT/ML IJ SOLN: 0.0000 [IU] | Freq: Three times a day (TID) | INTRAMUSCULAR | Status: DC

## 2022-11-07 MED ORDER — FUROSEMIDE 20 MG PO TABS
20.0000 mg | ORAL_TABLET | Freq: Every day | ORAL | Status: DC
  Administered 2022-11-07: 20 mg via ORAL
  Filled 2022-11-07: qty 1

## 2022-11-07 MED ORDER — VITAMIN B-12 1000 MCG PO TABS: 1000.0000 ug | ORAL_TABLET | Freq: Every day | ORAL | 3 refills | Status: AC

## 2022-11-07 MED ORDER — INSULIN ASPART 100 UNIT/ML IJ SOLN: 0.0000 [IU] | Freq: Every day | INTRAMUSCULAR | Status: DC

## 2022-11-07 NOTE — Progress Notes (Signed)
Rounding Note    Patient Name: Heidi Edwards Date of Encounter: 11/07/2022  [facility] Cardiologist: None  New  Subjective   Patient seen on AM rounds.  Denies any chest pain or shortness of breath.  Underwent left heart catheterization on 11/06/2022 without incident.  Inpatient Medications    Scheduled Meds:  aspirin  81 mg Oral Daily   atorvastatin  80 mg Oral Daily   cyanocobalamin  1,000 mcg Subcutaneous Daily   insulin aspart  0-6 Units Subcutaneous Q4H   isosorbide mononitrate  30 mg Oral Daily   metoprolol tartrate  25 mg Oral BID   sodium chloride flush  3 mL Intravenous Q12H   Continuous Infusions:  sodium chloride     PRN Meds: sodium chloride, acetaminophen, ALPRAZolam, HYDROcodone-acetaminophen, morphine injection, ondansetron (ZOFRAN) IV, sodium chloride flush   Vital Signs    Vitals:   11/06/22 2020 11/07/22 0021 11/07/22 0359 11/07/22 0807  BP: (!) 164/60 (!) 140/63 (!) 151/64 (!) 145/53  Pulse: 76 62 69 63  Resp: 20 18 18 16   Temp: 98 F (36.7 C) 98.2 F (36.8 C) 98.2 F (36.8 C) 98.1 F (36.7 C)  TempSrc:      SpO2: 98% 98% 96% 96%  Weight:      Height:        Intake/Output Summary (Last 24 hours) at 11/07/2022 0948 Last data filed at 11/07/2022 0030 Gross per 24 hour  Intake 720 ml  Output --  Net 720 ml      11/06/2022    4:09 AM 11/03/2022    2:53 PM 11/28/2017    9:11 PM  Last 3 Weights  Weight (lbs) 174 lb 13.2 oz 185 lb 196 lb  Weight (kg) 79.3 kg 83.915 kg 88.905 kg      Telemetry    Sinus rhythm 60-70 with 1 beat run of nonsustained V. tach noted on telemetry- Personally Reviewed  ECG    No new tracings- Personally Reviewed  Physical Exam   GEN: No acute distress.   Neck: No JVD Cardiac: RRR, no murmurs, rubs, or gallops.  Right radial cath site with Left radial pulse, gauze and OpSite dressing intact to site, no bleeding bruising or hematoma noted Respiratory: Clear to the upper lobes slightly diminished  bases to auscultation bilaterally.  Rations are unlabored at rest on room air GI: Soft, nontender, non-distended  MS: No edema; No deformity. Neuro:  Nonfocal  Psych: Normal affect   Labs    High Sensitivity Troponin:   Recent Labs  Lab 11/03/22 1504 11/03/22 1720 11/03/22 1943 11/04/22 1255  TROPONINIHS 132* 881* 1,950* 2,348*     Chemistry Recent Labs  Lab 11/03/22 1504 11/04/22 0112 11/05/22 0427 11/07/22 0443  NA 140 138 140 136  K 5.2* 4.5 4.3 3.8  CL 104 105 108 102  CO2 26 24 25 25   GLUCOSE 87 211* 162* 244*  BUN 21 22 18 19   CREATININE 1.15* 1.09* 0.97 1.09*  CALCIUM 8.7* 8.4* 9.1 8.4*  MG  --   --  2.1  --   PROT 7.2  --   --   --   ALBUMIN 3.9  --   --   --   AST 21  --   --   --   ALT 10  --   --   --   ALKPHOS 56  --   --   --   BILITOT 0.9  --   --   --  GFRNONAA 50* 54* >60 54*  ANIONGAP 10 9 7 9     Lipids  Recent Labs  Lab 11/04/22 0551  CHOL 117  TRIG 90  HDL 35*  LDLCALC 64  CHOLHDL 3.3    Hematology Recent Labs  Lab 11/05/22 0427 11/06/22 0545 11/07/22 0443  WBC 8.4 8.0 9.3  RBC 3.42* 3.33* 3.20*  HGB 8.3* 8.2* 7.9*  HCT 27.9* 27.5* 26.2*  MCV 81.6 82.6 81.9  MCH 24.3* 24.6* 24.7*  MCHC 29.7* 29.8* 30.2  RDW 14.9 15.0 15.0  PLT 351 336 330   Thyroid No results for input(s): "TSH", "FREET4" in the last 168 hours.  BNP Recent Labs  Lab 11/03/22 1504  BNP 52.5    DDimer No results for input(s): "DDIMER" in the last 168 hours.   Radiology      Cardiac Studies   CARDIAC CATHETERIZATION  Result Date: 11/06/2022 Conclusions: Mild, non-obstructive coronary artery disease with 10-20% stenoses involving ostial LAD and ostial D2.  No angiographically significant disease noted in the LMCA, LCx, or RCA.  No culprit lesion identified for the patient's chest pain and elevated troponin (MINOCA).  Consider supply-demand mismatch in the setting of significant anemia. Normal left ventricular systolic function (LVEF 55-65%) with  mildly elevated filling pressure (LVEDP 23 mmHg). Recommendations: Medical therapy and risk factor modification to prevent progression of coronary artery disease. Gentle diuresis. Proceed with GI workup of severe anemia. Yvonne Kendall, MD [facility]   TTE (11/04/2022):  1. Left ventricular ejection fraction, by estimation, is 55 to 60%. The  left ventricle has normal function. The left ventricle has no regional  wall motion abnormalities. Left ventricular diastolic parameters were  normal.   2. Right ventricular systolic function is normal. The right ventricular  size is normal. Tricuspid regurgitation signal is inadequate for assessing  PA pressure.   3. The interatrial septum is very thickened symmetrically in the mid  portion. 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 Could consider TEE or cardiac MRI for further evaluation.   4. The mitral valve is normal in structure. Trivial mitral valve  regurgitation. No evidence of mitral stenosis.   5. The aortic valve is tricuspid. Aortic valve regurgitation is not  visualized. Aortic valve sclerosis/calcification is present, without any  evidence of aortic stenosis.   6. The inferior vena cava is normal in size with greater than 50%  respiratory variability, suggesting right atrial pressure of 3 mmHg.   Patient Profile     72 y.o. female with past medical history of hypertension, hyperlipidemia, type 2 diabetes, COPD, degenerative disc disease, who was admitted and evaluated for chest pain and elevated troponin complicated by severe anemia.  Assessment & Plan    NSTEMI -remains chest pain free -High-sensitivity troponin peaked at 2000 -Left heart catheterization revealed mild nonobstructive coronary artery disease consistent with [facility] -She is continued on atorvastatin to prevent progression of disease -Continued on Imdur and metoprolol -Encouraged to maintain hemoglobin greater than 8 to prevent  cardiovascular strain -Aspirin therapy discontinued  Iron deficiency anemia -Hemoglobin this a.m. 7.9 -Previously was 8.2 -Chronicity is unclear -Continue management per IM and GI  Acute HFpEF -LVEF 55-60%, LVEDP mildly elevated -Give 1 dose of furosemide 20 mg IV status post left heart catheterization -Restarted ACE inhibitor and PTA furosemide today for better blood pressure control and renal protection -Can consider SGLT2 inhibitor in the future if ongoing symptoms  Essential hypertension -Blood pressure 145/53 -Restarted PTA lisinopril and furosemide -Continued on metoprolol to  tartrate and Imdur -Vital signs per unit protocol  Type 2 diabetes -Continued on insulin -Can consider addition of SGLT2 inhibitor as an outpatient -Continue management per IM     For questions or updates, please contact [facility] Please consult [email or web address] for contact info under        Signed, Izack Hoogland, NP  11/07/2022, 9:48 AM

## 2022-11-07 NOTE — Discharge Summary (Incomplete)
Physician Discharge Summary   Patient: Heidi Edwards MRN: [id number] DOB: 06/16/1950  Admit date:     11/03/2022  Discharge date: 11/07/22  Discharge Physician: Marcelino Duster   PCP: Selinda Michaels, MD   Recommendations at discharge:  {Tip this will not be part of the note when signed- Example include specific recommendations for outpatient follow-up, pending tests to follow-up on. (Optional):26781}  ***  Discharge Diagnoses: Principal Problem:   NSTEMI (non-ST elevated myocardial infarction) (HCC) Active Problems:   Vitamin B12 deficiency anemia   BP (high blood pressure)   HLD (hyperlipidemia)   Type 2 diabetes mellitus (HCC)   Iron deficiency anemia   Acute heart failure with preserved ejection fraction (HFpEF) (HCC)  Resolved Problems:   * No resolved hospital problems. *  Hospital Course: No notes on file  Assessment and Plan: * NSTEMI (non-ST elevated myocardial infarction) [facility]) Patient with typical chest pain since about this afternoon with central chest pressure and radiating up the neck and down the left arm Positive diaphoresis Symptomatically improved with aspirin nitroglycerin Troponin 130s EKG normal sinus rhythm Noted hemoglobin 8.4-may be a confounding issue with component of demand ischemia Heart score 6+ with noted risk factors including age, hyperlipidemia, hypertension, type 2 diabetes Positive mild anterior chest wall tenderness palpation Status post full dose aspirin Started on a heparin drip in the ER Will plan to admit for formal evaluation including restratification labs, 2D echo On-call cardiology consulted for formal evaluation  Vitamin B12 deficiency anemia Hemoglobin 8.4 on presentation today Looks like baseline hemoglobin is around 12 No reported active episodes of bleeding May be confounder to STEMI Will check anemia panel Trend hemoglobin Transfuse for hemoglobin less than 7 Follow  Type 2 diabetes mellitus  (HCC) SSI A1c  HLD (hyperlipidemia) Lipid panel pending Statin    BP (high blood pressure) BP stable Titrate home regimen.      {Tip this will not be part of the note when signed Body mass index is 29.09 kg/m. , ,  (Optional):26781}  {(NOTE) Pain control PDMP Statment (Optional):26782} Consultants: *** Procedures performed: ***  Disposition: {Plan; Disposition:26390} Diet recommendation:  Discharge Diet Orders (From admission, onward)     Start     Ordered   11/07/22 0000  Diet - low sodium heart healthy        11/07/22 1415   11/07/22 0000  Diet Carb Modified        11/07/22 1415           {Diet_Plan:26776} DISCHARGE MEDICATION: Allergies as of 11/07/2022       Reactions   Shellfish Allergy Nausea And Vomiting   Percocet [oxycodone-acetaminophen]         Medication List     STOP taking these medications    HYDROcodone-acetaminophen 5-325 MG tablet Commonly known as: NORCO/VICODIN       TAKE these medications    amitriptyline 25 MG tablet Commonly known as: ELAVIL Take 25 mg by mouth at bedtime.   cyanocobalamin 1000 MCG tablet Commonly known as: VITAMIN B12 Take 1 tablet (1,000 mcg total) by mouth daily.   ferrous sulfate 325 (65 FE) MG EC tablet Take 1 tablet (325 mg total) by mouth 2 (two) times daily.   furosemide 20 MG tablet Commonly known as: LASIX Take 1 tablet (20 mg total) by mouth daily. What changed:  how much to take when to take this   gabapentin 300 MG capsule Commonly known as: NEURONTIN Take 300 mg by mouth 2 (two) times  daily.   glipiZIDE 10 MG tablet Commonly known as: GLUCOTROL Take 10 mg by mouth daily before breakfast.   metFORMIN 1000 MG tablet Commonly known as: GLUCOPHAGE Take 1,000 mg by mouth 2 (two) times daily with a meal.   naproxen 500 MG EC tablet Commonly known as: EC NAPROSYN Take 1 tablet (500 mg total) by mouth 2 (two) times daily with a meal.   omeprazole 40 MG capsule Commonly known  as: PRILOSEC Take 40 mg by mouth daily.   rosuvastatin 10 MG tablet Commonly known as: CRESTOR Take 10 mg by mouth daily.        Discharge Exam: Filed Weights   11/03/22 1453 11/06/22 0409  Weight: 83.9 kg 79.3 kg   ***  Condition at discharge: {DC Condition:26389}  The results of significant diagnostics from this hospitalization (including imaging, microbiology, ancillary and laboratory) are listed below for reference.   Imaging Studies: CARDIAC CATHETERIZATION  Result Date: 11/06/2022 Conclusions: Mild, non-obstructive coronary artery disease with 10-20% stenoses involving ostial LAD and ostial D2.  No angiographically significant disease noted in the LMCA, LCx, or RCA.  No culprit lesion identified for the patient's chest pain and elevated troponin (MINOCA).  Consider supply-demand mismatch in the setting of significant anemia. Normal left ventricular systolic function (LVEF 55-65%) with mildly elevated filling pressure (LVEDP 23 mmHg). Recommendations: Medical therapy and risk factor modification to prevent progression of coronary artery disease. Gentle diuresis. Proceed with GI workup of severe anemia. Yvonne Kendall, MD [facility]  ECHOCARDIOGRAM COMPLETE  Result Date: 11/04/2022    ECHOCARDIOGRAM REPORT   Patient Name:   Heidi Edwards Date of Exam: 11/04/2022 Medical Rec #:  [id number]     Height:       65.0 in Accession #:    [id number]    Weight:       185.0 lb Date of Birth:  12/26/1950      BSA:          1.914 m Patient Age:    72 years      BP:           127/47 mmHg Patient Gender: F             HR:           73 bpm. Exam Location:  [facility] Procedure: 2D Echo Indications:     NSTEMI I21.4  History:         Patient has no prior history of Echocardiogram examinations.  Sonographer:     Overton Mam RDCS, FASE Referring Phys:  [id number] Francoise Schaumann [place] Diagnosing Phys: Armanda Magic MD  Sonographer Comments: Suboptimal parasternal window. Image acquisition challenging due to  patient body habitus. IMPRESSIONS  1. Left ventricular ejection fraction, by estimation, is 55 to 60%. The left ventricle has normal function. The left ventricle has no regional wall motion abnormalities. Left ventricular diastolic parameters were normal.  2. Right ventricular systolic function is normal. The right ventricular size is normal. Tricuspid regurgitation signal is inadequate for assessing PA pressure.  3. The interatrial septum is very thickened symmetrically in the mid portion. 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 Could consider TEE or cardiac MRI for further evaluation.  4. The mitral valve is normal in structure. Trivial mitral valve regurgitation. No evidence of mitral stenosis.  5. The aortic valve is tricuspid. Aortic valve regurgitation is not visualized. Aortic valve sclerosis/calcification is present, without any evidence of aortic stenosis.  6. The inferior vena cava is normal  in size with greater than 50% respiratory variability, suggesting right atrial pressure of 3 mmHg. FINDINGS  Left Ventricle: Left ventricular ejection fraction, by estimation, is 55 to 60%. The left ventricle has normal function. The left ventricle has no regional wall motion abnormalities. The left ventricular internal cavity size was normal in size. There is  no left ventricular hypertrophy. Left ventricular diastolic parameters were normal. Normal left ventricular filling pressure. Right Ventricle: The right ventricular size is normal. No increase in right ventricular wall thickness. Right ventricular systolic function is normal. Tricuspid regurgitation signal is inadequate for assessing PA pressure. Left Atrium: Left atrial size was normal in size. Right Atrium: Right atrial size was normal in size. Pericardium: There is no evidence of pericardial effusion. Mitral Valve: The mitral valve is normal in structure. Trivial mitral valve regurgitation. No evidence of mitral valve stenosis.  Tricuspid Valve: The tricuspid valve is normal in structure. Tricuspid valve regurgitation is not demonstrated. No evidence of tricuspid stenosis. Aortic Valve: The aortic valve is tricuspid. Aortic valve regurgitation is not visualized. Aortic valve sclerosis/calcification is present, without any evidence of aortic stenosis. Aortic valve peak gradient measures 11.4 mmHg. Pulmonic Valve: The pulmonic valve was normal in structure. Pulmonic valve regurgitation is not visualized. No evidence of pulmonic stenosis. Aorta: The aortic root is normal in size and structure. Venous: The inferior vena cava is normal in size with greater than 50% respiratory variability, suggesting right atrial pressure of 3 mmHg. IAS/Shunts: No atrial level shunt detected by color flow Doppler.  LEFT VENTRICLE PLAX 2D LVIDd:         4.60 cm     Diastology LVIDs:         3.20 cm     LV e' medial:    10.40 cm/s LV PW:         1.00 cm     LV E/e' medial:  10.8 LV IVS:        0.90 cm     LV e' lateral:   8.16 cm/s LVOT diam:     2.00 cm     LV E/e' lateral: 13.7 LVOT Area:     3.14 cm  LV Volumes (MOD) LV vol d, MOD A4C: 62.1 ml LV vol s, MOD A4C: 26.7 ml LV SV MOD A4C:     62.1 ml RIGHT VENTRICLE RV Basal diam:  2.60 cm RV S prime:     14.70 cm/s TAPSE (M-mode): 2.0 cm LEFT ATRIUM             Index        RIGHT ATRIUM          Index LA diam:        3.90 cm 2.04 cm/m   RA Area:     7.99 cm LA Vol (A2C):   74.4 ml 38.88 ml/m  RA Volume:   14.10 ml 7.37 ml/m LA Vol (A4C):   49.6 ml 25.92 ml/m LA Biplane Vol: 61.8 ml 32.29 ml/m  AORTIC VALVE AV Vmax:      169.00 cm/s AV Peak Grad: 11.4 mmHg  AORTA Ao Root diam: 2.50 cm Ao Asc diam:  2.40 cm MITRAL VALVE MV Area (PHT): 3.60 cm     SHUNTS MV Decel Time: 211 msec     Systemic Diam: 2.00 cm MV E velocity: 112.00 cm/s MV A velocity: 111.00 cm/s MV E/A ratio:  1.01 Armanda Magic MD Electronically signed by Armanda Magic MD Signature Date/Time: 11/04/2022/9:56:04 PM    Final (Updated)  DG Chest  Port 1 View  Result Date: 11/03/2022 CLINICAL DATA:  Chest pain EXAM: PORTABLE CHEST 1 VIEW COMPARISON:  11/28/2017 FINDINGS: Mild cardiomegaly. Aortic atherosclerosis. Diffusely prominent interstitial markings. No pleural effusion or pneumothorax. IMPRESSION: Mild cardiomegaly with diffusely prominent interstitial markings, which may reflect mild interstitial edema. Electronically Signed   By: Duanne Guess D.O.   On: 11/03/2022 14:57    Microbiology: No results found for this or any previous visit.  Labs: CBC: Recent Labs  Lab 11/03/22 1504 11/04/22 0551 11/05/22 0427 11/06/22 0545 11/07/22 0443  WBC 6.8 7.3 8.4 8.0 9.3  NEUTROABS 4.6  --   --   --   --   HGB 8.4* 7.7* 8.3* 8.2* 7.9*  HCT 28.3* 26.3* 27.9* 27.5* 26.2*  MCV 83.0 82.7 81.6 82.6 81.9  PLT 371 323 351 336 330   Basic Metabolic Panel: Recent Labs  Lab 11/03/22 1504 11/04/22 0112 11/05/22 0427 11/07/22 0443  NA 140 138 140 136  K 5.2* 4.5 4.3 3.8  CL 104 105 108 102  CO2 26 24 25 25   GLUCOSE 87 211* 162* 244*  BUN 21 22 18 19   CREATININE 1.15* 1.09* 0.97 1.09*  CALCIUM 8.7* 8.4* 9.1 8.4*  MG  --   --  2.1  --    Liver Function Tests: Recent Labs  Lab 11/03/22 1504  AST 21  ALT 10  ALKPHOS 56  BILITOT 0.9  PROT 7.2  ALBUMIN 3.9   CBG: Recent Labs  Lab 11/06/22 2020 11/07/22 0018 11/07/22 0400 11/07/22 0808 11/07/22 1147  GLUCAP 237* 162* 231* 156* 206*    Discharge time spent: {LESS THAN/GREATER THAN:26388} 30 minutes.  Signed: Marcelino Duster, MD [facility] 11/07/2022

## 2022-11-07 NOTE — Plan of Care (Signed)

## 2022-11-07 NOTE — Inpatient Diabetes Management (Addendum)
Inpatient Diabetes Program Recommendations  AACE/ADA: New Consensus Statement on Inpatient Glycemic Control (2015)  Target Ranges:  Prepandial:   less than 140 mg/dL      Peak postprandial:   less than 180 mg/dL (1-2 hours)      Critically ill patients:  140 - 180 mg/dL   Lab Results  Component Value Date   GLUCAP 156 (H) 11/07/2022   HGBA1C 8.1 (H) 11/03/2022    Review of Glycemic Control  Latest Reference Range & Units 11/06/22 20:20 11/07/22 00:18 11/07/22 04:00 11/07/22 08:08  Glucose-Capillary 70 - 99 mg/dL 952 (H) 841 (H) 324 (H) 156 (H)  (H): Data is abnormally high Diabetes history: Type 2 DM Outpatient Diabetes medications: Glipizide 10 mg QA, Metformin 1000 mg BID Current orders for Inpatient glycemic control: Novolog 0-6 units Q4H  Inpatient Diabetes Program Recommendations:    Consider adding Semglee 8 units every day and changing correction to TID & HS (now that patient has a diet order).   Thanks, Lujean Rave, MSN, RNC-OB Diabetes Coordinator [phone] (8a-5p)

## 2022-11-08 LAB — LIPOPROTEIN A (LPA): Lipoprotein (a): <8.4 nmol/L (ref <75.0)

## 2022-11-20 ENCOUNTER — Telehealth: Payer: Self-pay | Admitting: Internal Medicine

## 2022-11-20 NOTE — Telephone Encounter (Signed)
   Pre-operative Risk Assessment    Patient Name: Heidi Edwards  DOB: Nov 14, 1950 MRN: [id number]      Request for Surgical Clearance    Procedure:  Cataract surgery both eyes  Date of Surgery:  Clearance TBD                                 Surgeon:  Deberah Pelton, MD Surgeon's Group or Practice Name:  [facility] Phone number:  [phone] Fax number:  [phone]   Type of Clearance Requested:   - Medical    Type of Anesthesia:  Not Indicated   Additional requests/questions:    Signed, Narda Amber   11/20/2022, 3:51 PM

## 2022-11-21 NOTE — Telephone Encounter (Signed)
   Patient Name: LAURABETH YIP  DOB: [birth year] MRN: [id number]  Primary Cardiologist: None  Chart reviewed as part of pre-operative protocol coverage. Cataract extractions are recognized in guidelines as low risk surgeries that do not typically require specific preoperative testing or holding of blood thinner therapy. Therefore, given past medical history and time since last visit, based on ACC/AHA guidelines, REILYN NELSON would be at acceptable risk for the planned procedure without further cardiovascular testing.   I will route this recommendation to the requesting party via Epic fax function and remove from pre-op pool.  Please call with questions.  Sharlene Dory, PA-C 11/21/2022, 7:46 AM

## 2022-12-17 ENCOUNTER — Encounter: Payer: Self-pay | Admitting: Ophthalmology

## 2022-12-25 ENCOUNTER — Encounter: Payer: Self-pay | Admitting: Ophthalmology

## 2022-12-25 NOTE — Anesthesia Preprocedure Evaluation (Signed)
Anesthesia Evaluation  Patient identified by MRN, date of birth, ID band Patient awake    Reviewed: Allergy & Precautions, H&P , NPO status , Patient's Chart, lab work & pertinent test results  Airway Mallampati: I  TM Distance: >3 FB Neck ROM: Full    Dental  (+) Edentulous Lower, Edentulous Upper   Pulmonary neg pulmonary ROS, pneumonia, COPD, former smoker   Pulmonary exam normal breath sounds clear to auscultation       Cardiovascular hypertension, + CAD and + Past MI  Normal cardiovascular exam Rhythm:Regular Rate:Normal  11-04-22  1. Left ventricular ejection fraction, by estimation, is 55 to 60%. The  left ventricle has normal function. The left ventricle has no regional  wall motion abnormalities. Left ventricular diastolic parameters were  normal.   2. Right ventricular systolic function is normal. The right ventricular  size is normal. Tricuspid regurgitation signal is inadequate for assessing  PA pressure.   3. The interatrial septum is very thickened symmetrically in the mid  portion. 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 Could consider TEE or cardiac MRI for further evaluation.   4. The mitral valve is normal in structure. Trivial mitral valve  regurgitation. No evidence of mitral stenosis.   5. The aortic valve is tricuspid. Aortic valve regurgitation is not  visualized. Aortic valve sclerosis/calcification is present, without any  evidence of aortic stenosis.   6. The inferior vena cava is normal in size with greater than 50%  respiratory variability, suggesting right atrial pressure of 3 mmHg.   Conclusions: cath 11-06-22 1. Mild, non-obstructive coronary artery disease with 10-20% stenoses involving ostial LAD and ostial D2.  No angiographically significant disease noted in the LMCA, LCx, or RCA.  No culprit lesion identified for the patient's chest pain and elevated troponin  (MINOCA).  Consider supply-demand mismatch in the setting of significant anemia. 2. Normal left ventricular systolic function (LVEF 55-65%) with mildly elevated filling pressure (LVEDP 23 mmHg).   Recommendations: 1. Medical therapy and risk factor modification to prevent progression of coronary artery disease. 2. Gentle diuresis. 3. Proceed with GI workup of severe anemia.   Yvonne Kendall, MD [facility]  11-06-22     Neuro/Psych negative neurological ROS  negative psych ROS   GI/Hepatic Neg liver ROS,GERD  ,,  Endo/Other  diabetes    Renal/GU negative Renal ROS  negative genitourinary   Musculoskeletal  (+) Arthritis ,    Abdominal   Peds negative pediatric ROS (+)  Hematology negative hematology ROS (+) Blood dyscrasia, anemia   Anesthesia Other Findings Hypertension  Diabetes mellitus  COPD (chronic obstructive pulmonary disease)   GERD (gastroesophageal reflux disease) Anemia Mild Non-obstructive CAD   Reproductive/Obstetrics negative OB ROS                              Anesthesia Physical Anesthesia Plan  ASA: 3  Anesthesia Plan: MAC   Post-op Pain Management:    Induction: Intravenous  PONV Risk Score and Plan:   Airway Management Planned: Natural Airway and Nasal Cannula  Additional Equipment:   Intra-op Plan:   Post-operative Plan:   Informed Consent: I have reviewed the patients History and Physical, chart, labs and discussed the procedure including the risks, benefits and alternatives for the proposed anesthesia with the patient or authorized representative who has indicated his/her understanding and acceptance.     Dental Advisory Given  Plan Discussed with: Anesthesiologist, CRNA and  Surgeon  Anesthesia Plan Comments: (Patient consented for risks of anesthesia including but not limited to:  - adverse reactions to medications - damage to eyes, teeth, lips or other oral mucosa - nerve damage due to  positioning  - sore throat or hoarseness - Damage to heart, brain, nerves, lungs, other parts of body or loss of life  Patient voiced understanding.)         Anesthesia Quick Evaluation

## 2022-12-25 NOTE — Discharge Instructions (Signed)

## 2022-12-27 ENCOUNTER — Ambulatory Visit
Admission: RE | Admit: 2022-12-27 | Discharge: 2022-12-27 | Disposition: A | Discharge status: Home / Self Care | Payer: PPO | Attending: Ophthalmology | Admitting: Ophthalmology

## 2022-12-27 ENCOUNTER — Other Ambulatory Visit: Payer: Self-pay

## 2022-12-27 ENCOUNTER — Ambulatory Visit: Payer: PPO | Admitting: Anesthesiology

## 2022-12-27 ENCOUNTER — Encounter
Admission: RE | Disposition: A | Discharge status: Home / Self Care | Payer: Self-pay | Source: Home / Self Care | Attending: Ophthalmology

## 2022-12-27 DIAGNOSIS — Z5986 Financial insecurity: Secondary | ICD-10-CM | POA: Insufficient documentation

## 2022-12-27 DIAGNOSIS — J449 Chronic obstructive pulmonary disease, unspecified: Secondary | ICD-10-CM | POA: Insufficient documentation

## 2022-12-27 DIAGNOSIS — Z87891 Personal history of nicotine dependence: Secondary | ICD-10-CM | POA: Diagnosis not present

## 2022-12-27 DIAGNOSIS — I252 Old myocardial infarction: Secondary | ICD-10-CM | POA: Diagnosis not present

## 2022-12-27 DIAGNOSIS — I358 Other nonrheumatic aortic valve disorders: Secondary | ICD-10-CM | POA: Diagnosis not present

## 2022-12-27 DIAGNOSIS — E1136 Type 2 diabetes mellitus with diabetic cataract: Secondary | ICD-10-CM | POA: Insufficient documentation

## 2022-12-27 DIAGNOSIS — I10 Essential (primary) hypertension: Secondary | ICD-10-CM | POA: Diagnosis not present

## 2022-12-27 DIAGNOSIS — K219 Gastro-esophageal reflux disease without esophagitis: Secondary | ICD-10-CM | POA: Insufficient documentation

## 2022-12-27 DIAGNOSIS — I2510 Atherosclerotic heart disease of native coronary artery without angina pectoris: Secondary | ICD-10-CM | POA: Diagnosis not present

## 2022-12-27 DIAGNOSIS — H2511 Age-related nuclear cataract, right eye: Secondary | ICD-10-CM | POA: Diagnosis not present

## 2022-12-27 DIAGNOSIS — M1990 Unspecified osteoarthritis, unspecified site: Secondary | ICD-10-CM | POA: Diagnosis not present

## 2022-12-27 DIAGNOSIS — D649 Anemia, unspecified: Secondary | ICD-10-CM | POA: Insufficient documentation

## 2022-12-27 DIAGNOSIS — Z8701 Personal history of pneumonia (recurrent): Secondary | ICD-10-CM | POA: Insufficient documentation

## 2022-12-27 HISTORY — DX: Anemia, unspecified: D64.9

## 2022-12-27 HISTORY — PX: CATARACT EXTRACTION W/PHACO: SHX586

## 2022-12-27 HISTORY — DX: Atherosclerotic heart disease of native coronary artery without angina pectoris: I25.10

## 2022-12-27 LAB — GLUCOSE, CAPILLARY: Glucose-Capillary: 121 mg/dL — ABNORMAL HIGH (ref 70–99)

## 2022-12-27 SURGERY — PHACOEMULSIFICATION, CATARACT, WITH IOL INSERTION
Anesthesia: Monitor Anesthesia Care | Site: Eye | Laterality: Right

## 2022-12-27 MED ORDER — ARMC OPHTHALMIC DILATING DROPS
1.0000 | OPHTHALMIC | Status: DC | PRN
  Administered 2022-12-27 (×3): 1 via OPHTHALMIC

## 2022-12-27 MED ORDER — MIDAZOLAM HCL 2 MG/2ML IJ SOLN
INTRAMUSCULAR | Status: DC | PRN
  Administered 2022-12-27 (×2): 1 mg via INTRAVENOUS

## 2022-12-27 MED ORDER — ONDANSETRON HCL 4 MG/2ML IJ SOLN
INTRAMUSCULAR | Status: DC | PRN
  Administered 2022-12-27: 4 mg via INTRAVENOUS

## 2022-12-27 MED ORDER — SIGHTPATH DOSE#1 BSS IO SOLN
INTRAOCULAR | Status: DC | PRN
  Administered 2022-12-27: 126 mL via OPHTHALMIC

## 2022-12-27 MED ORDER — TETRACAINE HCL 0.5 % OP SOLN
1.0000 [drp] | OPHTHALMIC | Status: DC | PRN
  Administered 2022-12-27 (×3): 1 [drp] via OPHTHALMIC

## 2022-12-27 MED ORDER — SIGHTPATH DOSE#1 NA HYALUR & NA CHOND-NA HYALUR IO KIT
PACK | INTRAOCULAR | Status: DC | PRN
  Administered 2022-12-27: 1 via OPHTHALMIC

## 2022-12-27 MED ORDER — FENTANYL CITRATE (PF) 100 MCG/2ML IJ SOLN
INTRAMUSCULAR | Status: DC | PRN
  Administered 2022-12-27 (×2): 50 ug via INTRAVENOUS

## 2022-12-27 MED ORDER — LACTATED RINGERS IV SOLN: INTRAVENOUS | Status: DC

## 2022-12-27 MED ORDER — MOXIFLOXACIN HCL 0.5 % OP SOLN
OPHTHALMIC | Status: DC | PRN
  Administered 2022-12-27: .2 mL via OPHTHALMIC

## 2022-12-27 MED ORDER — LIDOCAINE HCL (PF) 2 % IJ SOLN
INTRAOCULAR | Status: DC | PRN
  Administered 2022-12-27: 1 mL via INTRAOCULAR

## 2022-12-27 MED ORDER — SIGHTPATH DOSE#1 BSS IO SOLN
INTRAOCULAR | Status: DC | PRN
  Administered 2022-12-27: 15 mL

## 2022-12-27 SURGICAL SUPPLY — 9 items
CATARACT SUITE SIGHTPATH (MISCELLANEOUS) ×1
DISSECTOR HYDRO NUCLEUS 50X22 (MISCELLANEOUS) ×1 IMPLANT
DRSG TEGADERM 2-3/8X2-3/4 SM (GAUZE/BANDAGES/DRESSINGS) ×1 IMPLANT
FEE CATARACT SUITE SIGHTPATH (MISCELLANEOUS) ×1 IMPLANT
GLOVE SURG SYN 7.5 E (GLOVE) ×1
GLOVE SURG SYN 7.5 PF PI (GLOVE) ×1 IMPLANT
GLOVE SURG SYN 8.5 E (GLOVE) ×1
GLOVE SURG SYN 8.5 PF PI (GLOVE) ×1 IMPLANT
LENS IOL TECNIS EYHANCE 22.5 (Intraocular Lens) IMPLANT

## 2022-12-27 NOTE — H&P (Signed)
[facility]   Primary Care Physician:  Selinda Michaels, MD Ophthalmologist: Dr. Deberah Pelton  Pre-Procedure History & Physical: HPI:  Heidi Edwards is a 72 y.o. female here for cataract surgery.   Past Medical History:  Diagnosis Date   Anemia    CAD (coronary artery disease)    COPD (chronic obstructive pulmonary disease) (HCC)    Diabetes mellitus without complication (HCC)    GERD (gastroesophageal reflux disease)    Hypertension     Past Surgical History:  Procedure Laterality Date   ABDOMINAL HYSTERECTOMY     CARPAL TUNNEL RELEASE     LEFT HEART CATH AND CORONARY ANGIOGRAPHY N/A 11/06/2022   Procedure: LEFT HEART CATH AND CORONARY ANGIOGRAPHY;  Surgeon: Yvonne Kendall, MD;  Location: [facility] INVASIVE CV LAB;  Service: Cardiovascular;  Laterality: N/A;   NECK SURGERY  2000   SPINE SURGERY      Prior to Admission medications   Medication Sig Start Date End Date Taking? Authorizing Provider  albuterol (VENTOLIN HFA) 108 (90 Base) MCG/ACT inhaler Inhale into the lungs every 6 (six) hours as needed for wheezing or shortness of breath.   Yes [provider]  amitriptyline (ELAVIL) 25 MG tablet Take 25 mg by mouth at bedtime.   Yes [provider]  cyanocobalamin (VITAMIN B12) 1000 MCG tablet Take 1 tablet (1,000 mcg total) by mouth daily. 11/07/22  Yes Marcelino Duster, MD  ferrous sulfate 325 (65 FE) MG EC tablet Take 1 tablet (325 mg total) by mouth 2 (two) times daily. 11/07/22 03/07/23 Yes Marcelino Duster, MD  furosemide (LASIX) 20 MG tablet Take 1 tablet (20 mg total) by mouth daily. 11/07/22  Yes Marcelino Duster, MD  gabapentin (NEURONTIN) 300 MG capsule Take 300 mg by mouth 2 (two) times daily.   Yes [provider]  glipiZIDE (GLUCOTROL) 10 MG tablet Take 10 mg by mouth daily before breakfast.   Yes [provider]  metFORMIN (GLUCOPHAGE) 1000 MG tablet Take 1,000 mg by mouth 2 (two) times daily with a meal.   Yes  [provider]  naproxen (EC NAPROSYN) 500 MG EC tablet Take 1 tablet (500 mg total) by mouth 2 (two) times daily with a meal. 09/10/15  Yes Menshew, Charlesetta Ivory, PA-C  omeprazole (PRILOSEC) 40 MG capsule Take 40 mg by mouth daily.   Yes [provider]  rosuvastatin (CRESTOR) 10 MG tablet Take 10 mg by mouth daily.   Yes [provider]    Allergies as of 11/20/2022 - Review Complete 11/06/2022  Allergen Reaction Noted   Shellfish allergy Nausea And Vomiting 11/03/2022   Percocet [oxycodone-acetaminophen]  11/28/2017    Family History  Problem Relation Age of Onset   Cancer Mother    Cancer Father     Social History   Socioeconomic History   Marital status: Married    Spouse name: Not on file   Number of children: Not on file   Years of education: Not on file   Highest education level: Not on file  Occupational History   Not on file  Tobacco Use   Smoking status: Former   Smokeless tobacco: Never  Substance and Sexual Activity   Alcohol use: Not on file   Drug use: Not on file   Sexual activity: Not on file  Other Topics Concern   Not on file  Social History Narrative   Not on file   Social Determinants of Health   Financial Resource Strain: Medium Risk (06/22/2020)  Received from [facility]   Overall Financial Resource Strain (CARDIA)    Difficulty of Paying Living Expenses: Somewhat hard  Food Insecurity: No Food Insecurity (11/07/2022)   Hunger Vital Sign    Worried About Running Out of Food in the Last Year: Never true    Ran Out of Food in the Last Year: Never true  Transportation Needs: No Transportation Needs (11/07/2022)   PRAPARE - Administrator, Civil Service (Medical): No    Lack of Transportation (Non-Medical): No  Physical Activity: Sufficiently Active (06/22/2020)   Received from [facility], [facility]   Exercise Vital Sign    Days of Exercise per Week: 5 days    Minutes of  Exercise per Session: 30 min  Stress: No Stress Concern Present (06/22/2020)   Received from [facility], [facility] of Occupational [facility] - Occupational Stress Questionnaire    Feeling of Stress : Not at all  Social Connections: Moderately Isolated (06/22/2020)   Received from [facility], [facility]   Social Connection and Isolation Panel [NHANES]    Frequency of Communication with Friends and Family: More than three times a week    Frequency of Social Gatherings with Friends and Family: More than three times a week    Attends Religious Services: Never    Database administrator or Organizations: No    Attends Banker Meetings: Never    Marital Status: Married  Catering manager Violence: Not At Risk (11/07/2022)   Humiliation, Afraid, Rape, and Kick questionnaire    Fear of Current or Ex-Partner: No    Emotionally Abused: No    Physically Abused: No    Sexually Abused: No    Review of Systems: See HPI, otherwise negative ROS  Physical Exam: BP (!) 194/81   Pulse (!) 108   Temp 97.7 F (36.5 C) (Temporal)   Resp 17   Ht 5\' 5"  (1.651 m)   Wt 83 kg   SpO2 95%   BMI 30.45 kg/m  General:   Alert, cooperative in NAD Head:  Normocephalic and atraumatic. Respiratory:  Normal work of breathing. Cardiovascular:  RRR  Impression/Plan: Heidi Edwards is here for cataract surgery.  Risks, benefits, limitations, and alternatives regarding cataract surgery have been reviewed with the patient.  Questions have been answered.  All parties agreeable.   Estanislado Pandy, MD  12/27/2022, 7:09 AM

## 2022-12-27 NOTE — Transfer of Care (Signed)
Immediate Anesthesia Transfer of Care Note  Patient: Heidi Edwards  Procedure(s) Performed: CATARACT EXTRACTION PHACO AND INTRAOCULAR LENS PLACEMENT (IOC) RIGHT DIABETIC  33.34  02:42.9 (Right: Eye)  Patient Location: PACU  Anesthesia Type: MAC  Level of Consciousness: awake, alert  and patient cooperative  Airway and Oxygen Therapy: Patient Spontanous Breathing and Patient connected to supplemental oxygen  Post-op Assessment: Post-op Vital signs reviewed, Patient's Cardiovascular Status Stable, Respiratory Function Stable, Patent Airway and No signs of Nausea or vomiting  Post-op Vital Signs: Reviewed and stable  Complications: No notable events documented.

## 2022-12-27 NOTE — Op Note (Signed)
OPERATIVE NOTE  Heidi Edwards [id number] 12/27/2022   PREOPERATIVE DIAGNOSIS: Nuclear sclerotic cataract right eye. H25.11   POSTOPERATIVE DIAGNOSIS: Nuclear sclerotic cataract right eye. H25.11   PROCEDURE:  Phacoemusification with posterior chamber intraocular lens placement of the right eye  Ultrasound time: Procedure(s): CATARACT EXTRACTION PHACO AND INTRAOCULAR LENS PLACEMENT (IOC) RIGHT DIABETIC  33.34  02:42.9 (Right)  LENS:   Implant Name Type Inv. Item Serial No. Manufacturer Lot No. LRB No. Used Action  LENS IOL TECNIS EYHANCE 22.5 - [id number] Intraocular Lens LENS IOL TECNIS EYHANCE 22.5 [id number] [employer]  Right 1 Implanted      SURGEON:  Julious Payer. Rolley Sims, MD   ANESTHESIA:  Topical with tetracaine drops, augmented with 1% preservative-free intracameral lidocaine.   COMPLICATIONS:  None.   DESCRIPTION OF PROCEDURE:  The patient was identified in the holding room and transported to the operating room and placed in the supine position under the operating microscope.  The right eye was identified as the operative eye, which was prepped and draped in the usual sterile ophthalmic fashion.   A 1 millimeter clear-corneal paracentesis was made superotemporally. Preservative-free 1% lidocaine mixed with 1:1,000 bisulfite-free aqueous solution of epinephrine was injected into the anterior chamber. The anterior chamber was then filled with Viscoat viscoelastic. A 2.4 millimeter keratome was used to make a clear-corneal incision inferotemporally. A curvilinear capsulorrhexis was made with a cystotome and capsulorrhexis forceps. Balanced salt solution was used to hydrodissect and hydrodelineate the nucleus. Phacoemulsification was then used to remove the lens nucleus and epinucleus. The remaining cortex was then removed using the irrigation and aspiration handpiece. Provisc was then placed into the capsular bag to distend it for lens placement. A +22.50 D DIB00 intraocular lens was then  injected into the capsular bag. The remaining viscoelastic was aspirated.   Wounds were hydrated with balanced salt solution.  The anterior chamber was inflated to a physiologic pressure with balanced salt solution.  No wound leaks were noted. Vigamox was injected intracamerally.  Timolol and Brimonidine drops were applied to the eye.  The patient was taken to the recovery room in stable condition without complications of anesthesia or surgery.  [name] 12/27/2022, 7:59 AM

## 2022-12-27 NOTE — Anesthesia Postprocedure Evaluation (Signed)
Anesthesia Post Note  Patient: Heidi Edwards  Procedure(s) Performed: CATARACT EXTRACTION PHACO AND INTRAOCULAR LENS PLACEMENT (IOC) RIGHT DIABETIC  33.34  02:42.9 (Right: Eye)  Patient location during evaluation: PACU Anesthesia Type: MAC Level of consciousness: awake and alert Pain management: pain level controlled Vital Signs Assessment: post-procedure vital signs reviewed and stable Respiratory status: spontaneous breathing, nonlabored ventilation, respiratory function stable and patient connected to nasal cannula oxygen Cardiovascular status: stable and blood pressure returned to baseline Postop Assessment: no apparent nausea or vomiting Anesthetic complications: no   No notable events documented.   Last Vitals:  Vitals:   12/27/22 0800 12/27/22 0808  BP: (!) 140/59 (!) 143/67  Pulse: 82   Resp: 18   Temp: (!) 36.4 C (!) 36.2 C  SpO2: 96%     Last Pain:  Vitals:   12/27/22 0808  TempSrc:   PainSc: 0-No pain                 Marisue Humble

## 2022-12-28 ENCOUNTER — Encounter: Payer: Self-pay | Admitting: Ophthalmology

## 2023-01-02 ENCOUNTER — Encounter: Payer: Self-pay | Admitting: Ophthalmology

## 2023-01-03 NOTE — Anesthesia Preprocedure Evaluation (Addendum)
Anesthesia Evaluation  Patient identified by MRN, date of birth, ID band Patient awake    Reviewed: Allergy & Precautions, H&P , NPO status , Patient's Chart, lab work & pertinent test results  Airway Mallampati: I  TM Distance: >3 FB Neck ROM: Full    Dental no notable dental hx. (+) Edentulous Upper, Edentulous Lower   Pulmonary COPD, former smoker   Pulmonary exam normal breath sounds clear to auscultation       Cardiovascular hypertension, + CAD and + Past MI  Normal cardiovascular exam Rhythm:Regular Rate:Normal  11-04-22  1. Left ventricular ejection fraction, by estimation, is 55 to 60%. The  left ventricle has normal function. The left ventricle has no regional  wall motion abnormalities. Left ventricular diastolic parameters were  normal.   2. Right ventricular systolic function is normal. The right ventricular  size is normal. Tricuspid regurgitation signal is inadequate for assessing  PA pressure.   3. The interatrial septum is very thickened symmetrically in the mid  portion. 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 Could consider TEE or cardiac MRI for further evaluation.   4. The mitral valve is normal in structure. Trivial mitral valve  regurgitation. No evidence of mitral stenosis.   5. The aortic valve is tricuspid. Aortic valve regurgitation is not  visualized. Aortic valve sclerosis/calcification is present, without any  evidence of aortic stenosis.   6. The inferior vena cava is normal in size with greater than 50%  respiratory variability, suggesting right atrial pressure of 3 mmHg.    Conclusions: cath 11-06-22 1.         Mild, non-obstructive coronary artery disease with 10-20% stenoses involving ostial LAD and ostial D2.  No angiographically significant disease noted in the LMCA, LCx, or RCA.  No culprit lesion identified for the patient's chest pain and elevated troponin  (MINOCA).  Consider supply-demand mismatch in the setting of significant anemia. 2.Normal left ventricular systolic function (LVEF 55-65%) with mildly elevated filling pressure (LVEDP 23 mmHg).   Recommendations: 1.Medical therapy and risk factor modification to prevent progression of coronary artery disease. 2.Gentle diuresis. 3.Proceed with GI workup of severe anemia.   Yvonne Kendall, MD [facility]   11-06-22       Neuro/Psych negative neurological ROS  negative psych ROS   GI/Hepatic Neg liver ROS,GERD  ,,  Endo/Other  diabetes    Renal/GU negative Renal ROS  negative genitourinary   Musculoskeletal negative musculoskeletal ROS (+) Arthritis ,    Abdominal   Peds negative pediatric ROS (+)  Hematology negative hematology ROS (+) Blood dyscrasia, anemia   Anesthesia Other Findings Previous cataract surgery 12-27-22  Hypertension  Diabetes mellitus  COPD  GERD  Anemia  CAD (coronary artery disease)    Reproductive/Obstetrics negative OB ROS                             Anesthesia Physical Anesthesia Plan  ASA: 3  Anesthesia Plan: MAC   Post-op Pain Management:    Induction: Intravenous  PONV Risk Score and Plan:   Airway Management Planned: Natural Airway and Nasal Cannula  Additional Equipment:   Intra-op Plan:   Post-operative Plan:   Informed Consent: I have reviewed the patients History and Physical, chart, labs and discussed the procedure including the risks, benefits and alternatives for the proposed anesthesia with the patient or authorized representative who has indicated his/her understanding and acceptance.     Dental  Advisory Given  Plan Discussed with: Anesthesiologist, CRNA and Surgeon  Anesthesia Plan Comments: (Patient consented for risks of anesthesia including but not limited to:  - adverse reactions to medications - damage to eyes, teeth, lips or other oral mucosa - nerve damage due to  positioning  - sore throat or hoarseness - Damage to heart, brain, nerves, lungs, other parts of body or loss of life  Patient voiced understanding.)        Anesthesia Quick Evaluation

## 2023-01-08 NOTE — Discharge Instructions (Signed)

## 2023-01-10 ENCOUNTER — Ambulatory Visit: Payer: PPO | Admitting: Anesthesiology

## 2023-01-10 ENCOUNTER — Ambulatory Visit
Admission: RE | Admit: 2023-01-10 | Discharge: 2023-01-10 | Disposition: A | Discharge status: Home / Self Care | Payer: PPO | Attending: Ophthalmology | Admitting: Ophthalmology

## 2023-01-10 ENCOUNTER — Encounter
Admission: RE | Disposition: A | Discharge status: Home / Self Care | Payer: Self-pay | Source: Home / Self Care | Attending: Ophthalmology

## 2023-01-10 ENCOUNTER — Encounter: Payer: Self-pay | Admitting: Ophthalmology

## 2023-01-10 ENCOUNTER — Other Ambulatory Visit: Payer: Self-pay

## 2023-01-10 DIAGNOSIS — H2512 Age-related nuclear cataract, left eye: Secondary | ICD-10-CM | POA: Diagnosis present

## 2023-01-10 DIAGNOSIS — J449 Chronic obstructive pulmonary disease, unspecified: Secondary | ICD-10-CM | POA: Insufficient documentation

## 2023-01-10 DIAGNOSIS — E1136 Type 2 diabetes mellitus with diabetic cataract: Secondary | ICD-10-CM | POA: Insufficient documentation

## 2023-01-10 DIAGNOSIS — Z7984 Long term (current) use of oral hypoglycemic drugs: Secondary | ICD-10-CM | POA: Diagnosis not present

## 2023-01-10 DIAGNOSIS — K219 Gastro-esophageal reflux disease without esophagitis: Secondary | ICD-10-CM | POA: Diagnosis not present

## 2023-01-10 DIAGNOSIS — I252 Old myocardial infarction: Secondary | ICD-10-CM | POA: Diagnosis not present

## 2023-01-10 DIAGNOSIS — I10 Essential (primary) hypertension: Secondary | ICD-10-CM | POA: Diagnosis not present

## 2023-01-10 DIAGNOSIS — Z87891 Personal history of nicotine dependence: Secondary | ICD-10-CM | POA: Insufficient documentation

## 2023-01-10 DIAGNOSIS — I2510 Atherosclerotic heart disease of native coronary artery without angina pectoris: Secondary | ICD-10-CM | POA: Insufficient documentation

## 2023-01-10 DIAGNOSIS — I700 Atherosclerosis of aorta: Secondary | ICD-10-CM | POA: Insufficient documentation

## 2023-01-10 HISTORY — PX: CATARACT EXTRACTION W/PHACO: SHX586

## 2023-01-10 LAB — GLUCOSE, CAPILLARY: Glucose-Capillary: 128 mg/dL — ABNORMAL HIGH (ref 70–99)

## 2023-01-10 SURGERY — PHACOEMULSIFICATION, CATARACT, WITH IOL INSERTION
Anesthesia: Monitor Anesthesia Care | Site: Eye | Laterality: Left

## 2023-01-10 MED ORDER — ARMC OPHTHALMIC DILATING DROPS
1.0000 | OPHTHALMIC | Status: DC | PRN
  Administered 2023-01-10 (×3): 1 via OPHTHALMIC

## 2023-01-10 MED ORDER — MOXIFLOXACIN HCL 0.5 % OP SOLN
OPHTHALMIC | Status: DC | PRN
  Administered 2023-01-10: .2 mL via OPHTHALMIC

## 2023-01-10 MED ORDER — BRIMONIDINE TARTRATE-TIMOLOL 0.2-0.5 % OP SOLN
OPHTHALMIC | Status: DC | PRN
  Administered 2023-01-10: 1 [drp] via OPHTHALMIC

## 2023-01-10 MED ORDER — LACTATED RINGERS IV SOLN: INTRAVENOUS | Status: DC

## 2023-01-10 MED ORDER — LIDOCAINE HCL (PF) 2 % IJ SOLN
INTRAOCULAR | Status: DC | PRN
  Administered 2023-01-10: 4 mL via INTRAOCULAR

## 2023-01-10 MED ORDER — SIGHTPATH DOSE#1 NA HYALUR & NA CHOND-NA HYALUR IO KIT
PACK | INTRAOCULAR | Status: DC | PRN
  Administered 2023-01-10: 1 via OPHTHALMIC

## 2023-01-10 MED ORDER — SIGHTPATH DOSE#1 BSS IO SOLN
INTRAOCULAR | Status: DC | PRN
  Administered 2023-01-10: 15 mL via INTRAOCULAR

## 2023-01-10 MED ORDER — SIGHTPATH DOSE#1 BSS IO SOLN
INTRAOCULAR | Status: DC | PRN
  Administered 2023-01-10: 82 mL via OPHTHALMIC

## 2023-01-10 MED ORDER — TETRACAINE HCL 0.5 % OP SOLN
1.0000 [drp] | OPHTHALMIC | Status: DC | PRN
  Administered 2023-01-10 (×3): 1 [drp] via OPHTHALMIC

## 2023-01-10 MED ORDER — FENTANYL CITRATE (PF) 100 MCG/2ML IJ SOLN
INTRAMUSCULAR | Status: DC | PRN
Start: 2023-01-10 — End: 2023-01-10
  Administered 2023-01-10: 50 ug via INTRAVENOUS

## 2023-01-10 MED ORDER — MIDAZOLAM HCL 5 MG/5ML IJ SOLN
INTRAMUSCULAR | Status: DC | PRN
  Administered 2023-01-10: 2 mg via INTRAVENOUS

## 2023-01-10 SURGICAL SUPPLY — 11 items
CANNULA ANT/CHMB 27G (MISCELLANEOUS) IMPLANT
CANNULA ANT/CHMB 27GA (MISCELLANEOUS)
CATARACT SUITE SIGHTPATH (MISCELLANEOUS) ×1
DISSECTOR HYDRO NUCLEUS 50X22 (MISCELLANEOUS) ×1 IMPLANT
DRSG TEGADERM 2-3/8X2-3/4 SM (GAUZE/BANDAGES/DRESSINGS) ×1 IMPLANT
FEE CATARACT SUITE SIGHTPATH (MISCELLANEOUS) ×1 IMPLANT
GLOVE SURG SYN 7.5 E (GLOVE) ×1
GLOVE SURG SYN 7.5 PF PI (GLOVE) ×1 IMPLANT
GLOVE SURG SYN 8.5 E (GLOVE) ×1
GLOVE SURG SYN 8.5 PF PI (GLOVE) ×1 IMPLANT
LENS IOL TECNIS EYHANCE 23.0 (Intraocular Lens) IMPLANT

## 2023-01-10 NOTE — Op Note (Signed)
OPERATIVE NOTE  Heidi Edwards [id number] 01/10/2023   PREOPERATIVE DIAGNOSIS: Nuclear sclerotic cataract left eye. H25.12   POSTOPERATIVE DIAGNOSIS: Nuclear sclerotic cataract left eye. H25.12   PROCEDURE:  Phacoemusification with posterior chamber intraocular lens placement of the left eye  Ultrasound time: Procedure(s): CATARACT EXTRACTION PHACO AND INTRAOCULAR LENS PLACEMENT (IOC) LEFT DIABETIC 19.46 01:38.4 (Left)  LENS:   Implant Name Type Inv. Item Serial No. Manufacturer Lot No. LRB No. Used Action  LENS IOL TECNIS EYHANCE 23.0 - [id number] Intraocular Lens LENS IOL TECNIS EYHANCE 23.0 [id number] [employer]  Left 1 Implanted      SURGEON:  Julious Payer. Rolley Sims, MD   ANESTHESIA:  Topical with tetracaine drops, augmented with 1% preservative-free intracameral lidocaine.   COMPLICATIONS:  None.   DESCRIPTION OF PROCEDURE:  The patient was identified in the holding room and transported to the operating room and placed in the supine position under the operating microscope.  The left eye was identified as the operative eye, which was prepped and draped in the usual sterile ophthalmic fashion.   A 1 millimeter clear-corneal paracentesis was made inferotemporally. Preservative-free 1% lidocaine mixed with 1:1,000 bisulfite-free aqueous solution of epinephrine was injected into the anterior chamber. The anterior chamber was then filled with Viscoat viscoelastic. A 2.4 millimeter keratome was used to make a clear-corneal incision superotemporally. A curvilinear capsulorrhexis was made with a cystotome and capsulorrhexis forceps. Balanced salt solution was used to hydrodissect and hydrodelineate the nucleus. Phacoemulsification was then used to remove the lens nucleus and epinucleus. The remaining cortex was then removed using the irrigation and aspiration handpiece. Provisc was then placed into the capsular bag to distend it for lens placement. An +23.00 D DIB00 intraocular lens was then injected  into the capsular bag. The remaining viscoelastic was aspirated.   Wounds were hydrated with balanced salt solution.  The anterior chamber was inflated to a physiologic pressure with balanced salt solution.  No wound leaks were noted. Vigamox was injected intracamerally.  Timolol and Brimonidine drops were applied to the eye.  The patient was taken to the recovery room in stable condition without complications of anesthesia or surgery.  Rolly Pancake [place] 01/10/2023, 11:46 AM

## 2023-01-10 NOTE — Anesthesia Postprocedure Evaluation (Signed)
Anesthesia Post Note  Patient: Heidi Edwards  Procedure(s) Performed: CATARACT EXTRACTION PHACO AND INTRAOCULAR LENS PLACEMENT (IOC) LEFT DIABETIC 19.46 01:38.4 (Left: Eye)  Patient location during evaluation: PACU Anesthesia Type: MAC Level of consciousness: awake and alert Pain management: pain level controlled Vital Signs Assessment: post-procedure vital signs reviewed and stable Respiratory status: spontaneous breathing, nonlabored ventilation, respiratory function stable and patient connected to nasal cannula oxygen Cardiovascular status: stable and blood pressure returned to baseline Postop Assessment: no apparent nausea or vomiting Anesthetic complications: no   No notable events documented.   Last Vitals:  Vitals:   01/10/23 1152 01/10/23 1154  BP: 127/62   Pulse: 73 77  Resp: 15 11  Temp:    SpO2: 95% 93%    Last Pain:  Vitals:   01/10/23 1149  TempSrc:   PainSc: 0-No pain                 [name]

## 2023-01-10 NOTE — H&P (Signed)
[facility]   Primary Care Physician:  Selinda Michaels, MD Ophthalmologist: Dr. Deberah Pelton  Pre-Procedure History & Physical: HPI:  Heidi Edwards is a 72 y.o. female here for cataract surgery.   Past Medical History:  Diagnosis Date   Anemia    CAD (coronary artery disease)    COPD (chronic obstructive pulmonary disease) (HCC)    Diabetes mellitus without complication (HCC)    GERD (gastroesophageal reflux disease)    Hypertension     Past Surgical History:  Procedure Laterality Date   ABDOMINAL HYSTERECTOMY     CARPAL TUNNEL RELEASE     CATARACT EXTRACTION W/PHACO Right 12/27/2022   Procedure: CATARACT EXTRACTION PHACO AND INTRAOCULAR LENS PLACEMENT (IOC) RIGHT DIABETIC  33.34  02:42.9;  Surgeon: Estanislado Pandy, MD;  Location: [facility];  Service: Ophthalmology;  Laterality: Right;   LEFT HEART CATH AND CORONARY ANGIOGRAPHY N/A 11/06/2022   Procedure: LEFT HEART CATH AND CORONARY ANGIOGRAPHY;  Surgeon: Yvonne Kendall, MD;  Location: [facility] INV[facility]IVE [facility];  Service: Cardiovascular;  Laterality: N/A;   NECK SURGERY  2000   SPINE SURGERY      Prior to Admission medications   Medication Sig Start Date End Date Taking? Authorizing Provider  aspirin EC 81 MG tablet Take 81 mg by mouth daily. Swallow whole.   Yes [provider]  chlorthalidone (HYGROTON) 25 MG tablet Take 25 mg by mouth daily.   Yes [provider]  ferrous sulfate 325 (65 FE) MG EC tablet Take 1 tablet (325 mg total) by mouth 2 (two) times daily. 11/07/22 03/07/23 Yes Marcelino Duster, MD  furosemide (LASIX) 20 MG tablet Take 1 tablet (20 mg total) by mouth daily. 11/07/22  Yes Marcelino Duster, MD  gabapentin (NEURONTIN) 300 MG capsule Take 300 mg by mouth 2 (two) times daily.   Yes [provider]  glipiZIDE (GLUCOTROL) 10 MG tablet Take 10 mg by mouth daily before breakfast.   Yes [provider]  HYDROcodone-acetaminophen (NORCO/VICODIN)  5-325 MG tablet Take 1 tablet by mouth every 6 (six) hours as needed for moderate pain.   Yes [provider]  lisinopril (ZESTRIL) 30 MG tablet Take 30 mg by mouth daily.   Yes [provider]  metFORMIN (GLUCOPHAGE) 1000 MG tablet Take 1,000 mg by mouth 2 (two) times daily with a meal.   Yes [provider]  omeprazole (PRILOSEC) 40 MG capsule Take 40 mg by mouth daily.   Yes [provider]  potassium chloride (KLOR-CON) 20 MEQ packet Take 20 mEq by mouth daily.   Yes [provider]  rosuvastatin (CRESTOR) 10 MG tablet Take 10 mg by mouth daily.   Yes [provider]  albuterol (VENTOLIN HFA) 108 (90 Base) MCG/ACT inhaler Inhale into the lungs every 6 (six) hours as needed for wheezing or shortness of breath. Patient not taking: Reported on 01/02/2023    [provider]  cyanocobalamin (VITAMIN B12) 1000 MCG tablet Take 1 tablet (1,000 mcg total) by mouth daily. Patient not taking: Reported on 01/02/2023 11/07/22   Marcelino Duster, MD    Allergies as of 11/20/2022 - Review Complete 11/06/2022  Allergen Reaction Noted   Shellfish allergy Nausea And Vomiting 11/03/2022   Percocet [oxycodone-acetaminophen]  11/28/2017    Family History  Problem Relation Age of Onset   Cancer Mother    Cancer Father     Social History   Socioeconomic History   Marital status: Married    Spouse name: Not on  file   Number of children: Not on file   Years of education: Not on file   Highest education level: Not on file  Occupational History   Not on file  Tobacco Use   Smoking status: Former   Smokeless tobacco: Never  Substance and Sexual Activity   Alcohol use: Not on file   Drug use: Not on file   Sexual activity: Not on file  Other Topics Concern   Not on file  Social History Narrative   Not on file   Social Determinants of Health   Financial Resource Strain: Medium Risk (06/22/2020)   Received from [facility], [facility]   Overall Financial Resource Strain (CARDIA)    Difficulty of Paying Living Expenses: Somewhat hard  Food Insecurity: No Food Insecurity (11/07/2022)   Hunger Vital Sign    Worried About Running Out of Food in the Last Year: Never true    Ran Out of Food in the Last Year: Never true  Transportation Needs: No Transportation Needs (11/07/2022)   PRAPARE - Administrator, Civil Service (Medical): No    Lack of Transportation (Non-Medical): No  Physical Activity: Sufficiently Active (06/22/2020)   Received from [facility], [facility]   Exercise Vital Sign    Days of Exercise per Week: 5 days    Minutes of Exercise per Session: 30 min  Stress: No Stress Concern Present (06/22/2020)   Received from [facility], [facility] - Occupational Stress Questionnaire    Feeling of Stress : Not at all  Social Connections: Moderately Isolated (06/22/2020)   Received from [facility], [facility]   Social Connection and Isolation Panel [NHANES]    Frequency of Communication with Friends and Family: More than three times a week    Frequency of Social Gatherings with Friends and Family: More than three times a week    Attends Religious Services: Never    Database administrator or Organizations: No    Attends Banker Meetings: Never    Marital Status: Married  Catering manager Violence: Not At Risk (11/07/2022)   Humiliation, Afraid, Rape, and Kick questionnaire    Fear of Current or Ex-Partner: No    Emotionally Abused: No    Physically Abused: No    Sexually Abused: No    Review of Systems: See HPI, otherwise negative ROS  Physical Exam: BP (!) 171/63   Temp 97.6 F (36.4 C) (Temporal)   Ht 5\' 5"  (1.651 m)   Wt 82.4 kg   SpO2 94%   BMI 30.24 kg/m  General:   Alert, cooperative in NAD Head:  Normocephalic and atraumatic. Respiratory:  Normal work of breathing. Cardiovascular:   RRR  Impression/Plan: Heidi Edwards is here for cataract surgery.  Risks, benefits, limitations, and alternatives regarding cataract surgery have been reviewed with the patient.  Questions have been answered.  All parties agreeable.   Estanislado Pandy, MD  01/10/2023, 11:10 AM

## 2023-01-10 NOTE — Transfer of Care (Signed)
Immediate Anesthesia Transfer of Care Note  Patient: Heidi Edwards  Procedure(s) Performed: CATARACT EXTRACTION PHACO AND INTRAOCULAR LENS PLACEMENT (IOC) LEFT DIABETIC 19.46 01:38.4 (Left: Eye)  Patient Location: PACU  Anesthesia Type: MAC  Level of Consciousness: awake, alert  and patient cooperative  Airway and Oxygen Therapy: Patient Spontanous Breathing and Patient connected to supplemental oxygen  Post-op Assessment: Post-op Vital signs reviewed, Patient's Cardiovascular Status Stable, Respiratory Function Stable, Patent Airway and No signs of Nausea or vomiting  Post-op Vital Signs: Reviewed and stable  Complications: No notable events documented.

## 2023-01-12 ENCOUNTER — Encounter: Payer: Self-pay | Admitting: Ophthalmology

## 2023-12-19 ENCOUNTER — Other Ambulatory Visit (HOSPITAL_COMMUNITY): Payer: Self-pay

## 2023-12-20 ENCOUNTER — Other Ambulatory Visit (HOSPITAL_COMMUNITY): Payer: Self-pay
